# Patient Record
Sex: Female | Born: 1965 | Race: White | Hispanic: No | Marital: Married | Smoking: Never smoker
Health system: Southern US, Community
[De-identification: ages and names within clinical notes are randomized; demographics above are authoritative.]

## PROBLEM LIST (undated history)

## (undated) DIAGNOSIS — S92919A Unspecified fracture of unspecified toe(s), initial encounter for closed fracture: Secondary | ICD-10-CM

## (undated) DIAGNOSIS — M1990 Unspecified osteoarthritis, unspecified site: Secondary | ICD-10-CM

## (undated) HISTORY — DX: Unspecified fracture of unspecified toe(s), initial encounter for closed fracture: S92.919A

## (undated) HISTORY — DX: Unspecified osteoarthritis, unspecified site: M19.90

## (undated) HISTORY — PX: NO PAST SURGERIES: SHX2092

---

## 2002-09-12 ENCOUNTER — Encounter: Payer: Self-pay | Admitting: Family Medicine

## 2002-09-12 ENCOUNTER — Ambulatory Visit (HOSPITAL_COMMUNITY): Admission: RE | Admit: 2002-09-12 | Discharge: 2002-09-12 | Payer: Self-pay | Admitting: Family Medicine

## 2004-10-09 ENCOUNTER — Other Ambulatory Visit: Admission: RE | Admit: 2004-10-09 | Discharge: 2004-10-09 | Payer: Self-pay | Admitting: Obstetrics and Gynecology

## 2006-05-03 DIAGNOSIS — N63 Unspecified lump in unspecified breast: Secondary | ICD-10-CM | POA: Insufficient documentation

## 2006-12-19 ENCOUNTER — Encounter: Admission: RE | Admit: 2006-12-19 | Discharge: 2006-12-19 | Payer: Self-pay | Admitting: Obstetrics and Gynecology

## 2006-12-21 ENCOUNTER — Encounter: Admission: RE | Admit: 2006-12-21 | Discharge: 2006-12-21 | Payer: Self-pay | Admitting: Obstetrics and Gynecology

## 2008-05-22 ENCOUNTER — Encounter: Admission: RE | Admit: 2008-05-22 | Discharge: 2008-05-22 | Payer: Self-pay | Admitting: Obstetrics and Gynecology

## 2009-05-23 ENCOUNTER — Encounter: Admission: RE | Admit: 2009-05-23 | Discharge: 2009-05-23 | Payer: Self-pay | Admitting: Obstetrics and Gynecology

## 2009-05-30 ENCOUNTER — Encounter: Admission: RE | Admit: 2009-05-30 | Discharge: 2009-05-30 | Payer: Self-pay | Admitting: Obstetrics and Gynecology

## 2010-05-24 ENCOUNTER — Encounter: Payer: Self-pay | Admitting: Obstetrics and Gynecology

## 2011-11-22 ENCOUNTER — Other Ambulatory Visit: Payer: Self-pay | Admitting: Physical Medicine and Rehabilitation

## 2011-11-22 DIAGNOSIS — M5030 Other cervical disc degeneration, unspecified cervical region: Secondary | ICD-10-CM

## 2011-11-22 DIAGNOSIS — M542 Cervicalgia: Secondary | ICD-10-CM

## 2011-12-01 ENCOUNTER — Ambulatory Visit
Admission: RE | Admit: 2011-12-01 | Discharge: 2011-12-01 | Disposition: A | Discharge status: Home / Self Care | Payer: PRIVATE HEALTH INSURANCE | Source: Ambulatory Visit | Attending: Physical Medicine and Rehabilitation | Admitting: Physical Medicine and Rehabilitation

## 2011-12-01 DIAGNOSIS — M542 Cervicalgia: Secondary | ICD-10-CM

## 2011-12-01 DIAGNOSIS — M5030 Other cervical disc degeneration, unspecified cervical region: Secondary | ICD-10-CM

## 2013-10-24 ENCOUNTER — Encounter: Payer: Self-pay | Admitting: Cardiovascular Disease

## 2013-10-24 ENCOUNTER — Ambulatory Visit (INDEPENDENT_AMBULATORY_CARE_PROVIDER_SITE_OTHER): Payer: PRIVATE HEALTH INSURANCE | Admitting: Cardiovascular Disease

## 2013-10-24 VITALS — BP 120/76 | HR 63 | Ht 65.0 in | Wt 170.3 lb

## 2013-10-24 DIAGNOSIS — Z79899 Other long term (current) drug therapy: Secondary | ICD-10-CM

## 2013-10-24 DIAGNOSIS — Z13220 Encounter for screening for lipoid disorders: Secondary | ICD-10-CM

## 2013-10-24 DIAGNOSIS — R5383 Other fatigue: Secondary | ICD-10-CM

## 2013-10-24 DIAGNOSIS — M47812 Spondylosis without myelopathy or radiculopathy, cervical region: Secondary | ICD-10-CM

## 2013-10-24 DIAGNOSIS — R209 Unspecified disturbances of skin sensation: Secondary | ICD-10-CM

## 2013-10-24 DIAGNOSIS — R5381 Other malaise: Secondary | ICD-10-CM

## 2013-10-24 DIAGNOSIS — Z8249 Family history of ischemic heart disease and other diseases of the circulatory system: Secondary | ICD-10-CM

## 2013-10-24 DIAGNOSIS — R202 Paresthesia of skin: Secondary | ICD-10-CM

## 2013-10-24 DIAGNOSIS — I491 Atrial premature depolarization: Secondary | ICD-10-CM

## 2013-10-24 NOTE — Patient Instructions (Signed)
Dr Tresa EndoKelly has ordered to you have blood work to be done FASTING. (CBC, NMR Lipoprofile, TSH, CMET).  Your physician has requested that you have an exercise tolerance test. For further information please visit [email or web address]. Please also follow instruction sheet, as given.  Dr Tresa EndoKelly wants you to follow-up in 1 year. You will receive a reminder letter in the mail one months in advance. If you don't receive a letter, please call our office to schedule the follow-up appointment.

## 2013-10-25 ENCOUNTER — Encounter: Payer: Self-pay | Admitting: Cardiovascular Disease

## 2013-10-25 DIAGNOSIS — Z8249 Family history of ischemic heart disease and other diseases of the circulatory system: Secondary | ICD-10-CM | POA: Insufficient documentation

## 2013-10-25 DIAGNOSIS — I491 Atrial premature depolarization: Secondary | ICD-10-CM | POA: Insufficient documentation

## 2013-10-25 DIAGNOSIS — M47812 Spondylosis without myelopathy or radiculopathy, cervical region: Secondary | ICD-10-CM | POA: Insufficient documentation

## 2013-10-25 NOTE — Progress Notes (Signed)
Patient ID: Toya Smothersatricia Ann Mires, female   DOB: 21-Jan-1966, 48 y.o.   MRN: [id number]     PATIENT PROFILE: Toya Smothersatricia Ann Kolander is a 48 y.o. female presents to the office today for cardiology evaluation.  She is the daughter of my patient Mr. Minerva AreolaWilliam Jones.   HPI:  Toya Smothersatricia Ann Carley is a 48 y.o. female who denies any known awareness of cardiac disease.  However, her father is a long-standing patient of mine and developed coronary artery disease in his 5940s.  She has remained fairly active.  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  She does walk and exercises doing strength training, as well as cardiac workouts.  His sugar is family history in addition to her father and also her grandparents who have had heart attacks.  She now presents to establish cardiology care in light of her family history for heart disease and for CAD screening.  She denies exertional chest pain.  She is unaware of palpitations.  She denies presyncope or syncope.  There is no PND or orthopnea.  History reviewed. No pertinent past medical history.  She does have a history of DJD of her neck.   History reviewed. No pertinent past surgical history. There is no surgical history.  Allergies  Allergen Reactions  . Erythromycin Nausea Only    Current Outpatient Prescriptions  Medication Sig Dispense Refill  . Cyanocobalamin (VITAMIN B12 PO) Take 1 capsule by mouth daily.      . meloxicam (MOBIC) 7.5 MG tablet Take 7.5 mg by mouth daily as needed for pain.      . metaxalone (SKELAXIN) 800 MG tablet Take 800 mg by mouth daily as needed for muscle spasms.      . Multiple Vitamin (MULTIVITAMIN) tablet Take 1 tablet by mouth daily.      . norethindrone-ethinyl estradiol 1/35 (ORTHO-NOVUM 1/35, 28,) tablet Take 1 tablet by mouth daily.       No current facility-administered medications for this visit.    Social she is married for 26 years.   Works as a Librarian, academiclegal assistant.  Her [employer].  She completed 12th grade of education.  There is no tobacco use.  She does drink an occasional glass of wine.  Family History  Problem Relation Age of Onset  . Angina Father [id number]  . Heart disease Father   . Heart attack Paternal Grandfather     ROS General: Negative; No fevers, chills, or night sweats HEENT: Negative; No changes in vision or hearing, sinus congestion, difficulty swallowing Pulmonary: Negative; No cough, wheezing, shortness of breath, hemoptysis Cardiovascular:  See HPI; No chest pain, presyncope, syncope, palpitations, edema GI: Negative; No nausea, vomiting, diarrhea, or abdominal pain GU: Negative; No dysuria, hematuria, or difficulty voiding Musculoskeletal: Cervical degenerative joint disease involving C3/4;  Hematologic/Oncologic: Negative; no easy bruising, bleeding Endocrine: Negative; no heat/cold intolerance; no diabetes Neuro: Negative; no changes in balance, headaches Skin: Negative; No rashes or skin lesions Psychiatric: Negative; No behavioral problems, depression Sleep: Negative; No daytime sleepiness, hypersomnolence, bruxism, restless legs, hypnogagnic hallucinations Other comprehensive 14 point system review is negative   Physical Exam BP 120/76  Pulse 63  Ht 5\' 5"  (1.651 m)  Wt 170 lb 4.8 oz (77.248 kg)  BMI 28.34 kg/m2 General: Alert, oriented, no distress.  Skin: normal turgor, no rashes, warm and dry HEENT: Normocephalic, atraumatic. Pupils equal round and reactive to light; sclera anicteric; extraocular muscles intact; Fundi normal  Nose without nasal septal hypertrophy Mouth/Parynx benign; Mallinpatti scale 2 Neck: No JVD, no carotid bruits; normal carotid upstroke Lungs: clear to ausculatation and percussion; no wheezing or rales Chest wall: without tenderness to palpitation Heart: PMI not displaced, RRR, s1 s2 normal, faint1/6 systolic murmur, no diastolic murmur, no rubs, gallops,  thrills, or heaves Abdomen: soft, nontender; no hepatosplenomehaly, BS+; abdominal aorta nontender and not dilated by palpation. Back: no CVA tenderness Pulses 2+ Musculoskeletal: full range of motion, normal strength, no joint deformities Extremities: no clubbing cyanosis or edema, Homan's sign negative  Neurologic: grossly nonfocal; Cranial nerves grossly wnl Psychologic: Normal mood and affect   ECG (independently read by me): Normal sinus rhythm at 63 beats per minute with occasional atrial premature complexes.  LABS:  BMET No results found for this basename: na, k, cl, co2, glucose, bun, creatinine, calcium, gfrnonaa, gfraa     Hepatic Function Panel  No results found for this basename: prot, albumin, ast, alt, alkphos, bilitot, bilidir, ibili     CBC No results found for this basename: wbc, rbc, hgb, hct, plt, mcv, mch, mchc, rdw, neutrabs, lymphsabs, monoabs, eosabs, basosabs     BNP No results found for this basename: probnp    Lipid Panel  No results found for this basename: chol, trig, hdl, cholhdl, vldl, ldlcalc      RADIOLOGY: No results found.   ASSESSMENT AND PLAN: Ms. Bryson Haatricia Wussow is a 48 year old female with a strong family history for premature coronary disease.  She denies any episodes of chest tightness or significant change in exercise tolerance.  Her EKG today does show occasional premature atrial contractions , which she is asymptomatic and has not since.  He does do a fair amount of strenuous activity, including kickboxing.  She does experience episodes of arm tingling and I suspect this may be related to her neck disease.  In light of her family history for CAD, PACs, and arm prior seizures, I have recommended a a screening graded exercise treadmill test. this will be helpful to assess for blood pressure response to exercise, as well as to see if she does have any exercise-induced rhythm abnormalities or ECG changes.  I'm also scheduling her  for a complete set of blood work including a CBC, comprehensive metabolic panel, TSH, in light of her family history I'm scheduling her for NMR LipoProfile for more aggressive evaluation of her lipid status.  I will contact her regarding the results of the above studies and recommendations if necessary will be made at that time.  He did discuss potential use of enteric-coated baby aspirin 81 mg as long as she does not have any GI issues and is not taking meloxicam which she occasional takes.  As long as she is stable, I will see her in one year for reevaluation.   Lennette Biharihomas A. Henning Ehle, MD, [facility] 10/25/2013 7:57 AM

## 2013-11-15 LAB — COMPREHENSIVE METABOLIC PANEL
ALBUMIN: 3.9 g/dL (ref 3.5–5.2)
ALT: 21 U/L (ref 0–35)
AST: 16 U/L (ref 0–37)
Alkaline Phosphatase: 50 U/L (ref 39–117)
BUN: 9 mg/dL (ref 6–23)
CO2: 25 meq/L (ref 19–32)
Calcium: 9 mg/dL (ref 8.4–10.5)
Chloride: 105 mEq/L (ref 96–112)
Creat: 0.68 mg/dL (ref 0.50–1.10)
GLUCOSE: 109 mg/dL — AB (ref 70–99)
POTASSIUM: 4.5 meq/L (ref 3.5–5.3)
SODIUM: 137 meq/L (ref 135–145)
TOTAL PROTEIN: 6.6 g/dL (ref 6.0–8.3)
Total Bilirubin: 0.7 mg/dL (ref 0.2–1.2)

## 2013-11-15 LAB — TSH: TSH: 1.842 u[IU]/mL (ref 0.350–4.500)

## 2013-11-15 LAB — CBC
HCT: 38.5 % (ref 36.0–46.0)
Hemoglobin: 13.3 g/dL (ref 12.0–15.0)
MCH: 32.8 pg (ref 26.0–34.0)
MCHC: 34.5 g/dL (ref 30.0–36.0)
MCV: 94.8 fL (ref 78.0–100.0)
PLATELETS: 277 10*3/uL (ref 150–400)
RBC: 4.06 MIL/uL (ref 3.87–5.11)
RDW: 12.9 % (ref 11.5–15.5)
WBC: 4.3 10*3/uL (ref 4.0–10.5)

## 2013-11-16 ENCOUNTER — Encounter (HOSPITAL_COMMUNITY): Payer: PRIVATE HEALTH INSURANCE

## 2013-11-16 LAB — NMR LIPOPROFILE WITH LIPIDS
CHOLESTEROL, TOTAL: 218 mg/dL — AB (ref <200)
HDL Particle Number: 42 umol/L (ref >=30.5)
HDL SIZE: 9.6 nm (ref >=9.2)
HDL-C: 73 mg/dL (ref >=40)
LARGE HDL: 12.8 umol/L (ref >=4.8)
LARGE VLDL-P: 1.7 nmol/L (ref <=2.7)
LDL (calc): 120 mg/dL — ABNORMAL HIGH (ref <100)
LDL Particle Number: 1602 nmol/L — ABNORMAL HIGH (ref <1000)
LDL Size: 21.3 nm (ref >20.5)
LP-IR Score: <25 (ref <=45)
SMALL LDL PARTICLE NUMBER: 582 nmol/L — AB (ref <=527)
TRIGLYCERIDES: 126 mg/dL (ref <150)
VLDL Size: 40.6 nm (ref <=46.6)

## 2013-11-27 ENCOUNTER — Telehealth (HOSPITAL_COMMUNITY): Payer: Self-pay

## 2013-11-27 NOTE — Telephone Encounter (Signed)
Encounter complete. 

## 2013-11-28 ENCOUNTER — Telehealth (HOSPITAL_COMMUNITY): Payer: Self-pay

## 2013-11-28 NOTE — Telephone Encounter (Signed)
Encounter complete. 

## 2013-11-29 ENCOUNTER — Ambulatory Visit (HOSPITAL_COMMUNITY)
Admission: RE | Admit: 2013-11-29 | Discharge: 2013-11-29 | Disposition: A | Discharge status: Home / Self Care | Payer: PRIVATE HEALTH INSURANCE | Source: Ambulatory Visit | Attending: Cardiology | Admitting: Cardiology

## 2013-11-29 DIAGNOSIS — R202 Paresthesia of skin: Secondary | ICD-10-CM

## 2013-11-29 DIAGNOSIS — Z8249 Family history of ischemic heart disease and other diseases of the circulatory system: Secondary | ICD-10-CM | POA: Insufficient documentation

## 2013-11-29 DIAGNOSIS — R209 Unspecified disturbances of skin sensation: Secondary | ICD-10-CM | POA: Insufficient documentation

## 2013-11-29 NOTE — Procedures (Signed)
Exercise Treadmill Test   Test  Exercise Tolerance Test Ordering MD: Nicki Guadalajarahomas Kelly, MD  Interpreting MD:   Unique Test No: 1  Treadmill:  1  Indication for ETT: Palpitations/Rule Out Ischemia  Contraindication to ETT: Yes   Stress Modality: exercise - treadmill  Cardiac Imaging Performed: non   Protocol: standard Bruce - maximal  Max BP:  168/67  Max MPHR (bpm):  170 85% MPR (bpm):  144  MPHR obtained (bpm):  176 % MPHR obtained:  102  Reached 85% MPHR (min:sec):  6:20 Total Exercise Time (min-sec):  10:03  Workload in METS: 11.80 Borg Scale:   Reason ETT Terminated:  fatigue    ST Segment Analysis At Rest: normal ST segments - no evidence of significant ST depression With Exercise: significant ischemic ST depression  Other Information Arrhythmia:  No Angina during ETT:  absent (0) Quality of ETT:  diagnostic  ETT Interpretation:  abnormal - evidence of ST depression consistent with ischemia  Comments: Excellent exercise tolerance 2-3 mm horizontal ST depression inferiorly and laterally at peak exercise No chest pain Normal BP Response Clinical correlation is advised - further testing may be warranted.  Chrystie NoseKenneth C. Hilty, MD, [facility] Attending Cardiologist [facility]

## 2013-12-12 ENCOUNTER — Telehealth: Payer: Self-pay | Admitting: Cardiovascular Disease

## 2013-12-12 NOTE — Telephone Encounter (Signed)
Follow Up    Pt calling to follow up on blood work results. Please call.

## 2013-12-12 NOTE — Telephone Encounter (Signed)
Dr. Tresa EndoKelly Please review and make recommendations. Patient is calling for results.

## 2013-12-13 NOTE — Telephone Encounter (Signed)
Tanya Andrews - I believe TK has sent these results to you.  Belenda CruiseKristin

## 2013-12-19 ENCOUNTER — Telehealth: Payer: Self-pay | Admitting: Cardiovascular Disease

## 2013-12-19 ENCOUNTER — Telehealth (HOSPITAL_COMMUNITY): Payer: Self-pay | Admitting: *Deleted

## 2013-12-19 ENCOUNTER — Other Ambulatory Visit: Payer: Self-pay | Admitting: *Deleted

## 2013-12-19 DIAGNOSIS — R9439 Abnormal result of other cardiovascular function study: Secondary | ICD-10-CM

## 2013-12-19 DIAGNOSIS — R9431 Abnormal electrocardiogram [ECG] [EKG]: Secondary | ICD-10-CM

## 2013-12-19 NOTE — Telephone Encounter (Signed)
Returned a call to patient she had some questions about the exercise myoview that Dr. Tresa EndoKelly has ordered. We also discussed patients lab results. All questions were answered to patients satisfaction.

## 2013-12-19 NOTE — Telephone Encounter (Signed)
Please call,pt says she have some questions about a test that she is suppose to have.

## 2013-12-25 ENCOUNTER — Telehealth (HOSPITAL_COMMUNITY): Payer: Self-pay

## 2013-12-25 NOTE — Telephone Encounter (Signed)
Encounter complete. 

## 2013-12-26 ENCOUNTER — Telehealth (HOSPITAL_COMMUNITY): Payer: Self-pay

## 2013-12-26 ENCOUNTER — Telehealth (HOSPITAL_COMMUNITY): Payer: Self-pay | Admitting: *Deleted

## 2013-12-26 NOTE — Telephone Encounter (Signed)
Encounter complete. 

## 2013-12-27 ENCOUNTER — Ambulatory Visit (HOSPITAL_COMMUNITY)
Admission: RE | Admit: 2013-12-27 | Discharge: 2013-12-27 | Disposition: A | Discharge status: Home / Self Care | Payer: PRIVATE HEALTH INSURANCE | Source: Ambulatory Visit | Attending: Cardiovascular Disease | Admitting: Cardiovascular Disease

## 2013-12-27 DIAGNOSIS — R9431 Abnormal electrocardiogram [ECG] [EKG]: Secondary | ICD-10-CM | POA: Diagnosis present

## 2013-12-27 DIAGNOSIS — R9439 Abnormal result of other cardiovascular function study: Secondary | ICD-10-CM | POA: Diagnosis not present

## 2013-12-27 MED ORDER — TECHNETIUM TC 99M SESTAMIBI GENERIC - CARDIOLITE
30.2000 | Freq: Once | INTRAVENOUS | Status: AC | PRN
  Administered 2013-12-27: 30.2 via INTRAVENOUS

## 2013-12-27 MED ORDER — TECHNETIUM TC 99M SESTAMIBI GENERIC - CARDIOLITE
10.4000 | Freq: Once | INTRAVENOUS | Status: AC | PRN
  Administered 2013-12-27: 10 via INTRAVENOUS

## 2013-12-27 NOTE — Procedures (Addendum)
[facility] [address] [place] [address] [phone]  Cardiology Nuclear Med Study  Tanya Andrews is a 48 y.o. female     MRN : [id number]     DOB: Jun 21, 1965  Procedure Date: 12/27/2013  Nuclear Med Background Indication for Stress Test:  Evaluation for Ischemia and Abnormal EKG History:  No prior cardiac or respiratory history reported;No prior NUC MPI for comparison;ECHO on 11/29/2013-abnormal Cardiac Risk Factors: Family History - CAD and Overweight  Symptoms:  Chest Pain and Palpitations   Nuclear Pre-Procedure Caffeine/Decaff Intake:  1:00am NPO After: 11am   IV Site: R Forearm  IV 0.9% NS with Angio Cath:  22g  Chest Size (in):  n/a IV Started by: Berdie Ogren, RN  Height:  (1.651 m)  Cup Size: D  BMI:  Body mass index is 28.29 kg/(m^2). Weight:  170 lb (77.111 kg)   Tech Comments:  n/a    Nuclear Med Study 1 or 2 day study: 1 day  Stress Test Type:  Stress  Order Authorizing Provider:  Nicki Guadalajara, MD   Resting Radionuclide: Technetium 46m Sestamibi  Resting Radionuclide Dose: 10.4 mCi   Stress Radionuclide:  Technetium 32m Sestamibi  Stress Radionuclide Dose: 30.2 mCi           Stress Protocol Rest HR: 75 Stress HR: 166  Rest BP:122/82 Stress BP: 185/84  Exercise Time (min): 8:35 METS: 10.10   Predicted Max HR: 170 bpm % Max HR: 44.12 bpm Rate Pressure Product: 65784  Dose of Adenosine (mg):  n/a Dose of Lexiscan: n/a mg  Dose of Atropine (mg): n/a Dose of Dobutamine: n/a mcg/kg/min (at max HR)  Stress Test Technologist: Ernestene Mention, CCT Nuclear Technologist: Gonzella Lex, CNMT   Rest Procedure:  Myocardial perfusion imaging was performed at rest 45 minutes following the intravenous administration of Technetium 48m Sestamibi. Stress Procedure:  The patient performed treadmill exercise using a Bruce  Protocol for 8 minutes 35 seconds. The patient stopped due to generalized  fatigue. Patient denied any chest pain.  There were significant ST-T wave changes.  Technetium 29m Sestamibi was injected at peak exercise and myocardial perfusion imaging was performed after a brief delay.  Transient Ischemic Dilatation (Normal <1.22):  1.07  QGS EDV:  73 ml QGS ESV:  28 ml LV Ejection Fraction: 62%        Rest ECG: NSR, RVCD.  Stress ECG: Significant ST abnormalities consistent with ischemia.  QPS Raw Data Images:  There is interference from nuclear activity from structures below the diaphragm. This does not affect the ability to read the study. Stress Images:  There is decreased uptake in the anterior wall. Rest Images:  There is decreased uptake in the anterior wall. Subtraction (SDS):  No evidence of ischemia.  Impression Exercise Capacity:  Fair exercise capacity. BP Response:  Normal blood pressure response. Clinical Symptoms:  No chest pain or dyspnea. ECG Impression:  Significant ST changes consistent with ischemia. Comparison with Prior Nuclear Study: No previous nuclear study performed  Overall Impression:  Low risk stress nuclear study with a small, moderate intensity, fixed anterior defect consistent with breast attenuation; no ischemia. Note positive ECG changes.  LV Wall Motion:  NL LV Function; NL Wall Motion   [name], MD  12/27/2013 4:49 PM

## 2015-08-08 ENCOUNTER — Telehealth: Payer: Self-pay | Admitting: Cardiovascular Disease

## 2015-08-08 NOTE — Telephone Encounter (Signed)
Pt of Dr. Tresa EndoKelly.  Returned phone call to patient. She describes radiating, dull pain - from her neck down through arm.  States she's had problem x ~2 months. Pt has longstanding history of cervical spine issues.  She also notes leg tingling which began recently.  Pt has a consultation w/ ortho/sports medicine for this issue & wants to make sure OK. Encouraged her that this is appropriate - if they feel is not nerve related but a PV issue,  they can order appropriate imaging or would call our office for guidance.  Advised pt to call if new symptoms. Pt aware if she feels she needs to be seen sooner by cardiology,  we can accomodate for PA visit based on acuity. At her request, scheduled for return OV w/ Dr. Tresa EndoKelly.  Pt amenable to plan going forward.

## 2015-08-08 NOTE — Telephone Encounter (Signed)
New message  Pt called for a same day appt.   Pt states that she has a heaviness in her arm she wants to make sure she doesn't have a blood clot. Heaviness for about 3-4 weeks.wants to rule out anything heart related.

## 2015-08-12 ENCOUNTER — Other Ambulatory Visit: Payer: Self-pay | Admitting: Physical Medicine and Rehabilitation

## 2015-08-12 DIAGNOSIS — M542 Cervicalgia: Principal | ICD-10-CM

## 2015-08-12 DIAGNOSIS — G8929 Other chronic pain: Secondary | ICD-10-CM

## 2015-08-25 ENCOUNTER — Ambulatory Visit
Admission: RE | Admit: 2015-08-25 | Discharge: 2015-08-25 | Disposition: A | Discharge status: Home / Self Care | Payer: PRIVATE HEALTH INSURANCE | Source: Ambulatory Visit | Attending: Physical Medicine and Rehabilitation | Admitting: Physical Medicine and Rehabilitation

## 2015-08-25 DIAGNOSIS — G8929 Other chronic pain: Secondary | ICD-10-CM

## 2015-08-25 DIAGNOSIS — M542 Cervicalgia: Principal | ICD-10-CM

## 2015-09-23 DIAGNOSIS — N951 Menopausal and female climacteric states: Secondary | ICD-10-CM | POA: Insufficient documentation

## 2015-11-10 ENCOUNTER — Telehealth: Payer: Self-pay

## 2015-11-10 NOTE — Telephone Encounter (Signed)
lmtcb need to update family and medical history

## 2015-11-14 ENCOUNTER — Ambulatory Visit (INDEPENDENT_AMBULATORY_CARE_PROVIDER_SITE_OTHER): Payer: PRIVATE HEALTH INSURANCE | Admitting: Cardiovascular Disease

## 2015-11-14 ENCOUNTER — Encounter: Payer: Self-pay | Admitting: Cardiovascular Disease

## 2015-11-14 VITALS — BP 106/65 | HR 73 | Ht 65.0 in | Wt 171.2 lb

## 2015-11-14 DIAGNOSIS — Z79899 Other long term (current) drug therapy: Secondary | ICD-10-CM

## 2015-11-14 DIAGNOSIS — M47812 Spondylosis without myelopathy or radiculopathy, cervical region: Secondary | ICD-10-CM

## 2015-11-14 DIAGNOSIS — Z8249 Family history of ischemic heart disease and other diseases of the circulatory system: Secondary | ICD-10-CM | POA: Diagnosis not present

## 2015-11-14 DIAGNOSIS — I491 Atrial premature depolarization: Secondary | ICD-10-CM | POA: Diagnosis not present

## 2015-11-14 NOTE — Patient Instructions (Signed)
Your physician recommends that you return for lab work fasting.   Your physician recommends that you schedule a follow-up appointment in: 2 years or sooner if needed.

## 2015-11-16 ENCOUNTER — Encounter: Payer: Self-pay | Admitting: Cardiovascular Disease

## 2015-11-16 NOTE — Progress Notes (Signed)
Patient ID: Elvis Boot, female   DOB: Mar 01, 1966, 50 y.o.   MRN: [id number]     PATIENT PROFILE: Burnice Vassel is a 50 y.o. female presents to the office today for a 2 year follow-up cardiology evaluation.  She is the daughter of my patient Mr. Iona Coach.   HPI:  Eboney Claybrook denies any known awareness of cardiac disease.  Her father is a long-standing patient of mine and developed coronary artery disease in his 60s.  She has remained fairly active.  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  She does walk and exercises doing strength training, as well as cardiac workouts.  His sugar is family history in addition to her father and also her grandparents who have had heart attacks.  I saw  Her 2 years ago when she presented to establish cardiology care in light of her family history for heart disease and for CAD screening.  I scheduled her for a routine treadmill test for screening evaluation and this was abnormal with the development of asymptomatic ST segment depression of 2-3 mm inferiorly and laterally.  As result, she was referred for a nuclear stress test which was done on 12/27/2013.  She again developed ST-T wave abnormalities which were asymptomatic.  Perfusion imaging was felt to be low risk and there was suggestion of probable breast attenuation without associated ischemia.  Over the past 2 years.  She is continued to be asymptomatic.  She exercises regularly.  She denies exertional chest pain.  She is unaware of palpitations.  She denies presyncope or syncope.  There is no PND or orthopnea.  She presents for 2 year follow-up evaluation.  Past Medical History  Diagnosis Date  . Degenerative joint disease     C3 and C4 vertebrae    She does have a history of DJD of her neck.   Past Surgical History  Procedure Laterality Date  . No past surgeries     There is no surgical  history.  Allergies  Allergen Reactions  . Erythromycin Nausea Only    Current Outpatient Prescriptions  Medication Sig Dispense Refill  . cyanocobalamin 100 MCG tablet Take 100 mcg by mouth daily.    . Fish Oil-Cholecalciferol (FISH OIL + D3 PO) Take by mouth.    . Multiple Vitamin (MULTIVITAMIN) tablet Take 1 tablet by mouth daily.     No current facility-administered medications for this visit.    Social she is married for 26 years.  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  She completed 12th grade of education.  There is no tobacco use.  She does drink an occasional glass of wine.  Family History  Problem Relation Age of Onset  . Angina Father 70  . Heart disease Father   . Heart attack Paternal Grandfather     ROS General: Negative; No fevers, chills, or night sweats HEENT: Negative; No changes in vision or hearing, sinus congestion, difficulty swallowing Pulmonary: Negative; No cough, wheezing, shortness of breath, hemoptysis Cardiovascular:  See HPI; No chest pain, presyncope, syncope, palpitations, edema GI: Negative; No nausea, vomiting, diarrhea, or abdominal pain GU: Negative; No dysuria, hematuria, or difficulty voiding Musculoskeletal: Cervical degenerative joint disease involving C3/4;  Hematologic/Oncologic: Negative; no easy bruising, bleeding Endocrine: Negative; no heat/cold intolerance; no diabetes Neuro: Negative; no changes in balance, headaches Skin: Negative; No rashes or skin lesions Psychiatric: Negative; No behavioral problems, depression Sleep:  Negative; No daytime sleepiness, hypersomnolence, bruxism, restless legs, hypnogagnic hallucinations Other comprehensive 14 point system review is negative   Physical Exam BP 106/65 mmHg  Pulse 73  Ht '5\' 5"'$  (1.651 m)  Wt 171 lb 3.2 oz (77.656 kg)  BMI 28.49 kg/m2   Wt Readings from Last 3 Encounters:  11/14/15 171 lb 3.2 oz (77.656 kg)  08/25/15 170 lb (77.111 kg)  12/27/13 170 lb  (77.111 kg)   General: Alert, oriented, no distress.  Skin: normal turgor, no rashes, warm and dry HEENT: Normocephalic, atraumatic. Pupils equal round and reactive to light; sclera anicteric; extraocular muscles intact; Fundi normal Nose without nasal septal hypertrophy Mouth/Parynx benign; Mallinpatti scale 2 Neck: No JVD, no carotid bruits; normal carotid upstroke Lungs: clear to ausculatation and percussion; no wheezing or rales Chest wall: without tenderness to palpitation Heart: PMI not displaced, RRR, s1 s2 normal, LGXQJ1/9 systolic murmur, no diastolic murmur, no rubs, gallops, thrills, or heaves Abdomen: soft, nontender; no hepatosplenomehaly, BS+; abdominal aorta nontender and not dilated by palpation. Back: no CVA tenderness Pulses 2+ Musculoskeletal: full range of motion, normal strength, no joint deformities Extremities: no clubbing cyanosis or edema, Homan's sign negative  Neurologic: grossly nonfocal; Cranial nerves grossly wnl Psychologic: Normal mood and affect  ECG (independently read by me): Normal sinus rhythm at 70 bpm.  Nondiagnostic T-wave change and 3.  Normal intervals.  ECG (independently read by me): Normal sinus rhythm at 63 beats per minute with occasional atrial premature complexes.  LABS: BMP Latest Ref Rng 11/15/2013  Glucose 70 - 99 mg/dL 109(H)  BUN 6 - 23 mg/dL 9  Creatinine 0.50 - 1.10 mg/dL 0.68  Sodium 135 - 145 mEq/L 137  Potassium 3.5 - 5.3 mEq/L 4.5  Chloride 96 - 112 mEq/L 105  CO2 19 - 32 mEq/L 25  Calcium 8.4 - 10.5 mg/dL 9.0   Hepatic Function Latest Ref Rng 11/15/2013  Total Protein 6.0 - 8.3 g/dL 6.6  Albumin 3.5 - 5.2 g/dL 3.9  AST 0 - 37 U/L 16  ALT 0 - 35 U/L 21  Alk Phosphatase 39 - 117 U/L 50  Total Bilirubin 0.2 - 1.2 mg/dL 0.7    CBC Latest Ref Rng 11/15/2013  WBC 4.0 - 10.5 K/uL 4.3  Hemoglobin 12.0 - 15.0 g/dL 13.3  Hematocrit 36.0 - 46.0 % 38.5  Platelets 150 - 400 K/uL 277    Lab Results  Component Value Date    MCV 94.8 11/15/2013    Lab Results  Component Value Date   TSH 1.842 11/15/2013   No results found for: HGBA1C  Lipid Panel     Component Value Date/Time   CHOL 218* 11/15/2013 0810   TRIG 126 11/15/2013 0810   HDL 73 11/15/2013 0810   LDLCALC 120* 11/15/2013 0810   RADIOLOGY: No results found.   ASSESSMENT AND PLAN: Ms. Raniyah Curenton is a 50 year old female with a strong family history for premature coronary disease.  She is asymptomatic with reference to chest pain, or any change in exercise tolerance.  At times she does have issues related to her cervical degenerative disc disease.  When I saw HER-2 years ago I referred her for routine treadmill test.  She developed asymptomatic ST segment depression which resulted in a subsequent nuclear perfusion study.  She again developed a systematic ST segment changes but was felt to have fairly normal perfusion with only mild breast attenuation artifact without associated ischemia.  I reviewed both her routine treadmill test and nuclear stress findings  with her in detail.  Presently, she continues to be active.  She specifically denies any change in exercise tolerance.  She did have mild lipid elevation  2 years ago.  I am scheduling her for follow-up blood work in the fasting state for further evaluation.  Presently, she is on meloxicam and Skelaxin on an as-needed basis.  I will contact her with results of blood work.  As long as she remains stable, I will see her in 2 years for reevaluation.  Troy Sine, MD, [facility] 11/16/2015 2:55 PM

## 2015-11-25 NOTE — Addendum Note (Signed)
Addended by: Freddi Starr on: 11/25/2015 04:14 PM   Modules accepted: Orders

## 2016-06-03 DIAGNOSIS — Z86010 Personal history of colonic polyps: Secondary | ICD-10-CM | POA: Insufficient documentation

## 2016-10-01 DIAGNOSIS — N952 Postmenopausal atrophic vaginitis: Secondary | ICD-10-CM | POA: Insufficient documentation

## 2018-03-20 DIAGNOSIS — Z803 Family history of malignant neoplasm of breast: Secondary | ICD-10-CM | POA: Insufficient documentation

## 2020-06-05 ENCOUNTER — Other Ambulatory Visit: Payer: Self-pay | Admitting: Obstetrics and Gynecology

## 2020-06-05 DIAGNOSIS — Z1231 Encounter for screening mammogram for malignant neoplasm of breast: Secondary | ICD-10-CM

## 2020-07-01 ENCOUNTER — Encounter: Payer: 59 | Admitting: Obstetrics and Gynecology

## 2020-07-22 ENCOUNTER — Ambulatory Visit: Payer: PRIVATE HEALTH INSURANCE

## 2020-07-23 ENCOUNTER — Ambulatory Visit
Admission: RE | Admit: 2020-07-23 | Discharge: 2020-07-23 | Disposition: A | Discharge status: Home / Self Care | Payer: PRIVATE HEALTH INSURANCE | Source: Ambulatory Visit | Attending: Obstetrics and Gynecology | Admitting: Obstetrics and Gynecology

## 2020-07-23 ENCOUNTER — Other Ambulatory Visit: Payer: Self-pay

## 2020-07-23 DIAGNOSIS — Z1231 Encounter for screening mammogram for malignant neoplasm of breast: Secondary | ICD-10-CM

## 2020-07-28 ENCOUNTER — Ambulatory Visit (INDEPENDENT_AMBULATORY_CARE_PROVIDER_SITE_OTHER): Payer: 59 | Admitting: Obstetrics and Gynecology

## 2020-07-28 ENCOUNTER — Encounter: Payer: Self-pay | Admitting: Obstetrics and Gynecology

## 2020-07-28 ENCOUNTER — Other Ambulatory Visit (HOSPITAL_COMMUNITY)
Admission: RE | Admit: 2020-07-28 | Discharge: 2020-07-28 | Disposition: A | Discharge status: Home / Self Care | Payer: 59 | Source: Ambulatory Visit | Attending: Obstetrics and Gynecology | Admitting: Obstetrics and Gynecology

## 2020-07-28 ENCOUNTER — Other Ambulatory Visit: Payer: Self-pay

## 2020-07-28 VITALS — BP 114/66 | HR 87 | Ht 65.0 in | Wt 166.0 lb

## 2020-07-28 DIAGNOSIS — Z01419 Encounter for gynecological examination (general) (routine) without abnormal findings: Secondary | ICD-10-CM | POA: Insufficient documentation

## 2020-07-28 MED ORDER — ESTRADIOL 0.1 MG/GM VA CREA: TOPICAL_CREAM | VAGINAL | 2 refills | Status: DC

## 2020-07-28 NOTE — Patient Instructions (Signed)
EXERCISE AND DIET:  We recommended that you start or continue a regular exercise program for good health. Regular exercise means any activity that makes your heart beat faster and makes you sweat.  We recommend exercising at least 30 minutes per day at least 3 days a week, preferably 4 or 5.  We also recommend a diet low in fat and sugar.  Inactivity, poor dietary choices and obesity can cause diabetes, heart attack, stroke, and kidney damage, among others.    ALCOHOL AND SMOKING:  Women should limit their alcohol intake to no more than 7 drinks/beers/glasses of wine (combined, not each!) per week. Moderation of alcohol intake to this level decreases your risk of breast cancer and liver damage. And of course, no recreational drugs are part of a healthy lifestyle.  And absolutely no smoking or even second hand smoke. Most people know smoking can cause heart and lung diseases, but did you know it also contributes to weakening of your bones? Aging of your skin?  Yellowing of your teeth and nails?  CALCIUM AND VITAMIN D:  Adequate intake of calcium and Vitamin D are recommended.  The recommendations for exact amounts of these supplements seem to change often, but generally speaking 600 mg of calcium (either carbonate or citrate) and 800 units of Vitamin D per day seems prudent. Certain women may benefit from higher intake of Vitamin D.  If you are among these women, your doctor will have told you during your visit.    PAP SMEARS:  Pap smears, to check for cervical cancer or precancers,  have traditionally been done yearly, although recent scientific advances have shown that most women can have pap smears less often.  However, every woman still should have a physical exam from her gynecologist every year. It will include a breast check, inspection of the vulva and vagina to check for abnormal growths or skin changes, a visual exam of the cervix, and then an exam to evaluate the size and shape of the uterus and  ovaries.  And after 57 years of age, a rectal exam is indicated to check for rectal cancers. We will also provide age appropriate advice regarding health maintenance, like when you should have certain vaccines, screening for sexually transmitted diseases, bone density testing, colonoscopy, mammograms, etc.   MAMMOGRAMS:  All women over 40 years old should have a yearly mammogram. Many facilities now offer a "3D" mammogram, which may cost around $50 extra out of pocket. If possible,  we recommend you accept the option to have the 3D mammogram performed.  It both reduces the number of women who will be called back for extra views which then turn out to be normal, and it is better than the routine mammogram at detecting truly abnormal areas.    COLONOSCOPY:  Colonoscopy to screen for colon cancer is recommended for all women at age 50.  We know, you hate the idea of the prep.  We agree, BUT, having colon cancer and not knowing it is worse!!  Colon cancer so often starts as a polyp that can be seen and removed at colonscopy, which can quite literally save your life!  And if your first colonoscopy is normal and you have no family history of colon cancer, most women don't have to have it again for 10 years.  Once every ten years, you can do something that may end up saving your life, right?  We will be happy to help you get it scheduled when you are ready.    Be sure to check your insurance coverage so you understand how much it will cost.  It may be covered as a preventative service at no cost, but you should check your particular policy.      Calcium Content in Foods Calcium is the most abundant mineral in the body. Most of the body's calcium supply is stored in bones and teeth. Calcium helps many parts of the body function normally, including:  Blood and blood vessels.  Nerves.  Hormones.  Muscles.  Bones and teeth. When your calcium stores are low, you may be at risk for low bone mass, bone loss, and  broken bones (fractures). When you get enough calcium, it helps to support strong bones and teeth throughout your life. Calcium is especially important for:  Children during growth spurts.  Girls during adolescence.  Women who are pregnant or breastfeeding.  Women after their menstrual cycle stops (postmenopause).  Women whose menstrual cycle has stopped due to anorexia nervosa or regular intense exercise.  People who cannot eat or digest dairy products.  Vegans. Recommended daily amounts of calcium:  Women (ages 44 to 30): 1,000 mg per day.  Women (ages 49 and older): 1,200 mg per day.  Men (ages 31 to 46): 1,000 mg per day.  Men (ages 41 and older): 1,200 mg per day.  Women (ages 63 to 44): 1,300 mg per day.  Men (ages 22 to 50): 1,300 mg per day. General information  Eat foods that are high in calcium. Try to get most of your calcium from food.  Some people may benefit from taking calcium supplements. Check with your health care provider or diet and nutrition specialist (dietitian) before starting any calcium supplements. Calcium supplements may interact with certain medicines. Too much calcium may cause other health problems, such as constipation and kidney stones.  For the body to absorb calcium, it needs vitamin D. Sources of vitamin D include: ? Skin exposure to direct sunlight. ? Foods, such as egg yolks, liver, mushrooms, saltwater fish, and fortified milk. ? Vitamin D supplements. Check with your health care provider or dietitian before starting any vitamin D supplements. What foods are high in calcium? Foods that are high in calcium contain more than 100 milligrams per serving. Fruits  Fortified orange juice or other fruit juice, 300 mg per 8 oz serving. Vegetables  Collard greens, 360 mg per 8 oz serving.  Kale, 100 mg per 8 oz serving.  Bok choy, 160 mg per 8 oz serving. Grains  Fortified ready-to-eat cereals, 100 to 1,000 mg per 8 oz  serving.  Fortified frozen waffles, 200 mg in 2 waffles.  Oatmeal, 140 mg in 1 cup. Meats and other proteins  Sardines, canned with bones, 325 mg per 3 oz serving.  Salmon, canned with bones, 180 mg per 3 oz serving.  Canned shrimp, 125 mg per 3 oz serving.  Baked beans, 160 mg per 4 oz serving.  Tofu, firm, made with calcium sulfate, 253 mg per 4 oz serving. Dairy  Yogurt, plain, low-fat, 310 mg per 6 oz serving.  Nonfat milk, 300 mg per 8 oz serving.  American cheese, 195 mg per 1 oz serving.  Cheddar cheese, 205 mg per 1 oz serving.  Cottage cheese 2%, 105 mg per 4 oz serving.  Fortified soy, rice, or almond milk, 300 mg per 8 oz serving.  Mozzarella, part skim, 210 mg per 1 oz serving. The items listed above may not be a complete list of foods high in calcium.  Actual amounts of calcium may be different depending on processing. Contact a dietitian for more information.   What foods are lower in calcium? Foods that are lower in calcium contain 50 mg or less per serving. Fruits  Apple, about 6 mg.  Banana, about 12 mg. Vegetables  Lettuce, 19 mg per 2 oz serving.  Tomato, about 11 mg. Grains  Rice, 4 mg per 6 oz serving.  Boiled potatoes, 14 mg per 8 oz serving.  White bread, 6 mg per slice. Meats and other proteins  Egg, 27 mg per 2 oz serving.  Red meat, 7 mg per 4 oz serving.  Chicken, 17 mg per 4 oz serving.  Fish, cod, or trout, 20 mg per 4 oz serving. Dairy  Cream cheese, regular, 14 mg per 1 Tbsp serving.  Brie cheese, 50 mg per 1 oz serving.  Parmesan cheese, 70 mg per 1 Tbsp serving. The items listed above may not be a complete list of foods lower in calcium. Actual amounts of calcium may be different depending on processing. Contact a dietitian for more information. Summary  Calcium is an important mineral in the body because it affects many functions. Getting enough calcium helps support strong bones and teeth throughout your  life.  Try to get most of your calcium from food.  Calcium supplements may interact with certain medicines. Check with your health care provider or dietitian before starting any calcium supplements. This information is not intended to replace advice given to you by your health care provider. Make sure you discuss any questions you have with your health care provider. Document Revised: 08/15/2019 Document Reviewed: 08/15/2019 Elsevier Patient Education  2021 [employer].

## 2020-07-28 NOTE — Progress Notes (Signed)
55 y.o. G16P0000 Married Caucasian female here for annual exam.    Patient complaining of vaginal dryness with intercourse. Using vagifem and estradiol cream.  Placing vagifem once a week.  Placing 2 grams of estradiol cream once a week. Her concern is dryness at the opening of the vagina.  Using vulvar balm for the vulva.   No itching and burning, but does have dryness.   Some night flushes.   Having weight challenges.  Feels better to be gluten free.   PCP: Donald Prose, MD    Patient's last menstrual period was 05/04/2015 (approximate).           Sexually active: Yes.    The current method of family planning is post menopausal status.    Exercising: Yes.    walking, pickle ball, golf Smoker:  no  Health Maintenance: Pap:  03/2019 normal per patient History of abnormal Pap:  no MMG: 07-23-20 BI-RADS 1, cat C density.  Colonoscopy: 2019 polyp;next 2024 BMD:   n/a  Result  n/a TDaP: up to date with PCP Gardasil:   no HIV:no Hep C:no Screening Labs:  PCP.    reports that she has never smoked. She has never used smokeless tobacco. She reports current alcohol use of about 5.0 standard drinks of alcohol per week.  Past Medical History:  Diagnosis Date  . 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 NO PAST SURGERIES      Current Outpatient Medications  Medication Sig Dispense Refill  . cyanocobalamin 100 MCG tablet Take 100 mcg by mouth daily.    Marland Kitchen estradiol (ESTRACE) 0.1 MG/GM vaginal cream estradiol 0.01% (0.1 mg/gram) vaginal cream  INSERT 1/2 APPLICATORFUL DAILY VAGINALLY AS INSTRUCTED    . Estradiol 10 MCG TABS vaginal tablet Vagifem 10 mcg vaginal tablet  one tablet twice a week.    . Fish Oil-Cholecalciferol (FISH OIL + D3 PO) Take by mouth.    . fluticasone (FLONASE) 50 MCG/ACT nasal spray 2 sprays as needed    . Krill Oil Omega-3 500 MG CAPS 2 capsules    . magnesium 30 MG tablet Take 30 mg by  mouth 2 (two) times daily.    . Multiple Vitamin (MULTIVITAMIN) tablet Take 1 tablet by mouth daily.    Marland Kitchen OVER THE COUNTER MEDICATION Nutrfil Optimal-M    . UNABLE TO FIND Med Name: Optimal V Takes 1 tablet daily     No current facility-administered medications for this visit.    Family History  Problem Relation Age of Onset  . Angina Father 58  . Heart disease Father   . Heart attack Paternal Grandfather   . Breast cancer Mother        71 & 29 had lumpectomy first time then mastectomy--Neg BRCA  . Diabetes Mother        diet controlled  . Hypertension Mother     Review of Systems  All other systems reviewed and are negative.   Exam:   BP 114/66   Pulse 87   Ht 5' 5" (1.651 m)   Wt 166 lb (75.3 kg)   LMP 05/04/2015 (Approximate)   SpO2 100%   BMI 27.62 kg/m     General appearance: alert, cooperative and appears stated age Head: normocephalic, without obvious abnormality, atraumatic Neck: no adenopathy, supple, symmetrical, trachea midline and thyroid normal to inspection and palpation Lungs: clear to auscultation bilaterally Breasts: normal appearance, no masses or  tenderness, No nipple retraction or dimpling, No nipple discharge or bleeding, No axillary adenopathy Heart: regular rate and rhythm Abdomen: soft, non-tender; no masses, no organomegaly Extremities: extremities normal, atraumatic, no cyanosis or edema Skin: skin color, texture, turgor normal. No rashes or lesions Lymph nodes: cervical, supraclavicular, and axillary nodes normal. Neurologic: grossly normal  Pelvic: External genitalia:  no lesions              No abnormal inguinal nodes palpated.              Urethra:  normal appearing urethra with no masses, tenderness or lesions              Bartholins and Skenes: normal                 Vagina: normal appearing vagina with normal color and discharge, no lesions              Cervix: no lesions              Pap taken: Yes.   Bimanual Exam:  Uterus:   normal size, contour, position, consistency, mobility, non-tender              Adnexa: no mass, fullness, tenderness              Rectal exam: Yes.  .  Confirms.              Anus:  normal sphincter tone, no lesions  Chaperone was present for exam.  Assessment:   Well woman visit with normal exam. FH breast cancer.  Mother.  Negative BRCA testing.  Menopausal atrophy.   Plan: Mammogram screening discussed. Self breast awareness reviewed. Pap and HR HPV as above. Guidelines for Calcium, Vitamin D, regular exercise program including cardiovascular and weight bearing exercise. Will stop vagifem and use estradiol cream 1/2 gram pv at hs every other day.   Labs with PCP.  Follow up annually and prn.   After visit summary provided.

## 2020-07-29 LAB — CYTOLOGY - PAP
Comment: NEGATIVE
Diagnosis: NEGATIVE
High risk HPV: NEGATIVE

## 2020-11-22 DIAGNOSIS — M545 Low back pain, unspecified: Secondary | ICD-10-CM | POA: Insufficient documentation

## 2021-06-19 ENCOUNTER — Other Ambulatory Visit: Payer: Self-pay | Admitting: Obstetrics and Gynecology

## 2021-06-19 DIAGNOSIS — Z1231 Encounter for screening mammogram for malignant neoplasm of breast: Secondary | ICD-10-CM

## 2021-07-24 ENCOUNTER — Ambulatory Visit: Payer: 59

## 2021-07-24 ENCOUNTER — Ambulatory Visit
Admission: RE | Admit: 2021-07-24 | Discharge: 2021-07-24 | Disposition: A | Discharge status: Home / Self Care | Payer: 59 | Source: Ambulatory Visit | Attending: Obstetrics and Gynecology | Admitting: Obstetrics and Gynecology

## 2021-07-24 DIAGNOSIS — Z1231 Encounter for screening mammogram for malignant neoplasm of breast: Secondary | ICD-10-CM

## 2021-08-11 ENCOUNTER — Encounter: Payer: Self-pay | Admitting: Obstetrics and Gynecology

## 2021-08-11 ENCOUNTER — Ambulatory Visit (INDEPENDENT_AMBULATORY_CARE_PROVIDER_SITE_OTHER): Payer: 59 | Admitting: Obstetrics and Gynecology

## 2021-08-11 VITALS — BP 104/62 | HR 61 | Ht 64.5 in | Wt 166.0 lb

## 2021-08-11 DIAGNOSIS — Z01419 Encounter for gynecological examination (general) (routine) without abnormal findings: Secondary | ICD-10-CM

## 2021-08-11 MED ORDER — ESTRADIOL 0.1 MG/GM VA CREA: TOPICAL_CREAM | VAGINAL | 2 refills | Status: DC

## 2021-08-11 NOTE — Progress Notes (Signed)
56 y.o. G44P0000 Married Caucasian female here for annual exam.   ? ?Followed for vaginal atrophy.  ?Using vaginal estrogen cream, 1/2 gram 2 - 3 times per week.  ?It is working well.  ? ?PCP:   [place] @ [facility] ? ?Patient's last menstrual period was 05/04/2015 (approximate).     ?  ?    ?Sexually active: Yes.    ?The current method of family planning is post menopausal status.    ?Exercising: Yes.     Works out at gym 2 days/week, yoga, walking ?Smoker:  no ? ?Health Maintenance: ?Pap:  07-28-20 Neg:Neg HR HPV, 03/2019 normal per patient ?History of abnormal Pap:  no ?MMG:  07-24-21 Neg:Neg HR HPv ?Colonoscopy:  2019 polyp;next 2024 ?BMD:   n/a  Result  n/a ?TDaP:  PCP ?Gardasil:   n/a ?HIV: no ?Hep C:no ?Screening Labs:  PCP. ? ? reports that she has never smoked. She has never used smokeless tobacco. She reports current alcohol use of about 5.0 standard drinks per week. She reports that she does not use drugs. ? ?Past Medical History:  ?Diagnosis Date  ? Degenerative joint disease   ? C3 and C4 vertebrae  ? Toe fracture   ? late 20s  ? ? ?Past Surgical History:  ?Procedure Laterality Date  ? NO PAST SURGERIES    ? ? ?Current Outpatient Medications  ?Medication Sig Dispense Refill  ? estradiol (ESTRACE) 0.1 MG/GM vaginal cream Place 1/2 gram per vagina at bedtime every other day. 42.5 g 2  ? Krill Oil Omega-3 500 MG CAPS 2 capsules    ? magnesium 30 MG tablet Take 30 mg by mouth 2 (two) times daily.    ? metaxalone (SKELAXIN) 800 MG tablet metaxalone 800 mg tablet ? Take 1 tablet 3 times a day by oral route.    ? naproxen (NAPROSYN) 500 MG tablet naproxen 500 mg tablet ? Take 1 tablet twice a day by oral route.    ? OVER THE COUNTER MEDICATION Nutrfil Optimal-M    ? UNABLE TO FIND Med Name: Optimal V ?Takes 1 tablet daily    ? ?No current facility-administered medications for this visit.  ? ? ?Family History  ?Problem Relation Age of Onset  ? Angina Father 23  ? Heart disease Father   ? Heart attack Paternal  Grandfather   ? Breast cancer Mother   ?     43 & 67 had lumpectomy first time then mastectomy--Neg BRCA  ? Diabetes Mother   ?     diet controlled  ? Hypertension Mother   ? ? ?Review of Systems  ?All other systems reviewed and are negative. ? ?Exam:   ?BP 104/62   Pulse 61   Ht 5' 4.5" (1.638 m)   Wt 166 lb (75.3 kg)   LMP 05/04/2015 (Approximate)   SpO2 98%   BMI 28.05 kg/m?     ?General appearance: alert, cooperative and appears stated age ?Head: normocephalic, without obvious abnormality, atraumatic ?Neck: no adenopathy, supple, symmetrical, trachea midline and thyroid normal to inspection and palpation ?Lungs: clear to auscultation bilaterally ?Breasts: normal appearance, no masses or tenderness, No nipple retraction or dimpling, No nipple discharge or bleeding, No axillary adenopathy ?Heart: regular rate and rhythm ?Abdomen: soft, non-tender; no masses, no organomegaly ?Extremities: extremities normal, atraumatic, no cyanosis or edema ?Skin: skin color, texture, turgor normal. No rashes or lesions ?Lymph nodes: cervical, supraclavicular, and axillary nodes normal. ?Neurologic: grossly normal ? ?Pelvic: External genitalia:  no lesions ?  No abnormal inguinal nodes palpated. ?             Urethra:  normal appearing urethra with no masses, tenderness or lesions ?             Bartholins and Skenes: normal    ?             Vagina: normal appearing vagina with normal color and discharge, no lesions ?             Cervix: no lesions ?             Pap taken: no ?Bimanual Exam:  Uterus:  normal size, contour, position, consistency, mobility, non-tender ?             Adnexa: no mass, fullness, tenderness ?             Rectal exam: yes.  Confirms. ?             Anus:  normal sphincter tone, no lesions ? ?Chaperone was present for exam:  Kimalexis, CMA. ? ?Assessment:   ?Well woman visit with gynecologic exam. ?FH breast cancer in mother.  Negative BRCA testing.  ?Menopausal atrophy.  ? ?Plan: ?Mammogram  screening discussed. ?Self breast awareness reviewed. ?Pap and HR HPV 2027. ?Guidelines for Calcium, Vitamin D, regular exercise program including cardiovascular and weight bearing exercise. ?Refill of vaginal estrogen cream.  I discussed potential effect on breast cancer.  ?Follow up annually and prn.  ? ?After visit summary provided.  ? ? ? ?

## 2021-08-11 NOTE — Patient Instructions (Signed)
EXERCISE AND DIET:  We recommended that you start or continue a regular exercise program for good health. Regular exercise means any activity that makes your heart beat faster and makes you sweat.  We recommend exercising at least 30 minutes per day at least 3 days a week, preferably 4 or 5.  We also recommend a diet low in fat and sugar.  Inactivity, poor dietary choices and obesity can cause diabetes, heart attack, stroke, and kidney damage, among others.   ? ?ALCOHOL AND SMOKING:  Women should limit their alcohol intake to no more than 7 drinks/beers/glasses of wine (combined, not each!) per week. Moderation of alcohol intake to this level decreases your risk of breast cancer and liver damage. And of course, no recreational drugs are part of a healthy lifestyle.  And absolutely no smoking or even second hand smoke. Most people know smoking can cause heart and lung diseases, but did you know it also contributes to weakening of your bones? Aging of your skin?  Yellowing of your teeth and nails? ? ?CALCIUM AND VITAMIN D:  Adequate intake of calcium and Vitamin D are recommended.  The recommendations for exact amounts of these supplements seem to change often, but generally speaking 600 mg of calcium (either carbonate or citrate) and 800 units of Vitamin D per day seems prudent. Certain women may benefit from higher intake of Vitamin D.  If you are among these women, your doctor will have told you during your visit.   ? ?PAP SMEARS:  Pap smears, to check for cervical cancer or precancers,  have traditionally been done yearly, although recent scientific advances have shown that most women can have pap smears less often.  However, every woman still should have a physical exam from her gynecologist every year. It will include a breast check, inspection of the vulva and vagina to check for abnormal growths or skin changes, a visual exam of the cervix, and then an exam to evaluate the size and shape of the uterus and  ovaries.  And after 58 years of age, a rectal exam is indicated to check for rectal cancers. We will also provide age appropriate advice regarding health maintenance, like when you should have certain vaccines, screening for sexually transmitted diseases, bone density testing, colonoscopy, mammograms, etc.  ? ?MAMMOGRAMS:  All women over 40 years old should have a yearly mammogram. Many facilities now offer a "3D" mammogram, which may cost around $50 extra out of pocket. If possible,  we recommend you accept the option to have the 3D mammogram performed.  It both reduces the number of women who will be called back for extra views which then turn out to be normal, and it is better than the routine mammogram at detecting truly abnormal areas.   ? ?COLONOSCOPY:  Colonoscopy to screen for colon cancer is recommended for all women at age 50.  We know, you hate the idea of the prep.  We agree, BUT, having colon cancer and not knowing it is worse!!  Colon cancer so often starts as a polyp that can be seen and removed at colonscopy, which can quite literally save your life!  And if your first colonoscopy is normal and you have no family history of colon cancer, most women don't have to have it again for 10 years.  Once every ten years, you can do something that may end up saving your life, right?  We will be happy to help you get it scheduled when you are ready.    Be sure to check your insurance coverage so you understand how much it will cost.  It may be covered as a preventative service at no cost, but you should check your particular policy.   ? ?Calcium Content in Foods ?Calcium is the most abundant mineral in the body. Most of the body's calcium supply is stored in bones and teeth. Calcium helps many parts of the body function normally, including: ?Blood and blood vessels. ?Nerves. ?Hormones. ?Muscles. ?Bones and teeth. ?When your calcium stores are low, you may be at risk for low bone mass, bone loss, and broken bones  (fractures). When you get enough calcium, it helps to support strong bones and teeth throughout your life. ?Calcium is especially important for: ?Children during growth spurts. ?Girls during adolescence. ?Women who are pregnant or breastfeeding. ?Women after their menstrual cycle stops (postmenopause). ?Women whose menstrual cycle has stopped due to anorexia nervosa or regular intense exercise. ?People who cannot eat or digest dairy products. ?Vegans. ?Recommended daily amounts of calcium: ?Women (ages 19 to 50): 1,000 mg per day. ?Women (ages 51 and older): 1,200 mg per day. ?Men (ages 19 to 70): 1,000 mg per day. ?Men (ages 71 and older): 1,200 mg per day. ?Women (ages 9 to 18): 1,300 mg per day. ?Men (ages 9 to 18): 1,300 mg per day. ?General information ?Eat foods that are high in calcium. Try to get most of your calcium from food. ?Some people may benefit from taking calcium supplements. Check with your health care provider or diet and nutrition specialist (dietitian) before starting any calcium supplements. Calcium supplements may interact with certain medicines. Too much calcium may cause other health problems, such as constipation and kidney stones. ?For the body to absorb calcium, it needs vitamin D. Sources of vitamin D include: ?Skin exposure to direct sunlight. ?Foods, such as egg yolks, liver, mushrooms, saltwater fish, and fortified milk. ?Vitamin D supplements. Check with your health care provider or dietitian before starting any vitamin D supplements. ?What foods are high in calcium? ?Foods that are high in calcium contain more than 100 milligrams per serving. ?Fruits ?Fortified orange juice or other fruit juice, 300 mg per 8 oz serving. ?Vegetables ?Collard greens, 360 mg per 8 oz serving. ?Kale, 100 mg per 8 oz serving. ?Bok choy, 160 mg per 8 oz serving. ?Grains ?Fortified ready-to-eat cereals, 100 to 1,000 mg per 8 oz serving. ?Fortified frozen waffles, 200 mg in 2 waffles. ?Oatmeal, 140 mg in 1  cup. ?Meats and other proteins ?Sardines, canned with bones, 325 mg per 3 oz serving. ?Salmon, canned with bones, 180 mg per 3 oz serving. ?Canned shrimp, 125 mg per 3 oz serving. ?Baked beans, 160 mg per 4 oz serving. ?Tofu, firm, made with calcium sulfate, 253 mg per 4 oz serving. ?Dairy ?Yogurt, plain, low-fat, 310 mg per 6 oz serving. ?Nonfat milk, 300 mg per 8 oz serving. ?American cheese, 195 mg per 1 oz serving. ?Cheddar cheese, 205 mg per 1 oz serving. ?Cottage cheese 2%, 105 mg per 4 oz serving. ?Fortified soy, rice, or almond milk, 300 mg per 8 oz serving. ?Mozzarella, part skim, 210 mg per 1 oz serving. ?The items listed above may not be a complete list of foods high in calcium. Actual amounts of calcium may be different depending on processing. Contact a dietitian for more information. ?What foods are lower in calcium? ?Foods that are lower in calcium contain 50 mg or less per serving. ?Fruits ?Apple, about 6 mg. ?Banana, about 12 mg. ?Vegetables ?  Lettuce, 19 mg per 2 oz serving. ?Tomato, about 11 mg. ?Grains ?Rice, 4 mg per 6 oz serving. ?Boiled potatoes, 14 mg per 8 oz serving. ?White bread, 6 mg per slice. ?Meats and other proteins ?Egg, 27 mg per 2 oz serving. ?Red meat, 7 mg per 4 oz serving. ?Chicken, 17 mg per 4 oz serving. ?Fish, cod, or trout, 20 mg per 4 oz serving. ?Dairy ?Cream cheese, regular, 14 mg per 1 Tbsp serving. ?Brie cheese, 50 mg per 1 oz serving. ?Parmesan cheese, 70 mg per 1 Tbsp serving. ?The items listed above may not be a complete list of foods lower in calcium. Actual amounts of calcium may be different depending on processing. Contact a dietitian for more information. ?Summary ?Calcium is an important mineral in the body because it affects many functions. Getting enough calcium helps support strong bones and teeth throughout your life. ?Try to get most of your calcium from food. ?Calcium supplements may interact with certain medicines. Check with your health care provider or  dietitian before starting any calcium supplements. ?This information is not intended to replace advice given to you by your health care provider. Make sure you discuss any questions you have with your hea

## 2021-09-17 ENCOUNTER — Encounter (HOSPITAL_BASED_OUTPATIENT_CLINIC_OR_DEPARTMENT_OTHER): Payer: Self-pay | Admitting: Nurse Practitioner

## 2021-09-17 ENCOUNTER — Ambulatory Visit (INDEPENDENT_AMBULATORY_CARE_PROVIDER_SITE_OTHER): Payer: 59 | Admitting: Nurse Practitioner

## 2021-09-17 VITALS — BP 117/78 | HR 65 | Ht 64.0 in | Wt 162.0 lb

## 2021-09-17 DIAGNOSIS — Z1329 Encounter for screening for other suspected endocrine disorder: Secondary | ICD-10-CM | POA: Diagnosis not present

## 2021-09-17 DIAGNOSIS — Z13228 Encounter for screening for other metabolic disorders: Secondary | ICD-10-CM

## 2021-09-17 DIAGNOSIS — Z0000 Encounter for general adult medical examination without abnormal findings: Secondary | ICD-10-CM | POA: Diagnosis not present

## 2021-09-17 DIAGNOSIS — Z130 Encounter for screening for diseases of the blood and blood-forming organs and certain disorders involving the immune mechanism: Secondary | ICD-10-CM

## 2021-09-17 DIAGNOSIS — E785 Hyperlipidemia, unspecified: Secondary | ICD-10-CM | POA: Insufficient documentation

## 2021-09-17 DIAGNOSIS — Z23 Encounter for immunization: Secondary | ICD-10-CM | POA: Diagnosis not present

## 2021-09-17 DIAGNOSIS — Z8249 Family history of ischemic heart disease and other diseases of the circulatory system: Secondary | ICD-10-CM | POA: Insufficient documentation

## 2021-09-17 DIAGNOSIS — R7301 Impaired fasting glucose: Secondary | ICD-10-CM | POA: Insufficient documentation

## 2021-09-17 DIAGNOSIS — Z1321 Encounter for screening for nutritional disorder: Secondary | ICD-10-CM

## 2021-09-17 MED ORDER — ZOSTER VAC RECOMB ADJUVANTED 50 MCG/0.5ML IM SUSR: 0.5000 mL | Freq: Once | INTRAMUSCULAR | 1 refills | Status: AC

## 2021-09-17 NOTE — Assessment & Plan Note (Signed)
New patient in office today.  CPE performed today. No alarm symptoms present at this time.  Will obtain labs today for further evaluation. Review of health maintenance with patient today.  She is due for shingles vaccine.  Patient provided with prescription to have this completed at the pharmacy. Colonoscopy last done approximately 5 years ago at [facility].  We will obtain records today to update her health maintenance.  She is due for a repeat colonoscopy next year.  She will let me know who she would like to schedule with to have that completed. Labs today for complete evaluation of health. Follow-up in 1 year with CPE or sooner if needed.

## 2021-09-17 NOTE — Patient Instructions (Signed)
Thank you for choosing [facility] for your Primary Care needs. I am excited for the opportunity to partner with you to meet your health care goals. It was a pleasure meeting you today!  Recommendations from today's visit: Everything looks very good today!  We will get some labs and make sure that there is nothing concerning. I will be in touch with you after all of the labs have come through and I have had a chance to review them.  Please let me know if you have any concerns in the future.  I have provided you with a printed for the Shingles vaccine If you would like me to send in a referral for colonoscopy, please let me know and we can do that  Information on diet, exercise, and health maintenance recommendations are listed below. This is information to help you be sure you are on track for optimal health and monitoring.   Please look over this and let us know if you have any questions or if you have completed any of the health maintenance outside of [facility] so that we can be sure your records are up to date.  ___________________________________________________________ About Me: I am an Adult-Geriatric Nurse Practitioner with a background in caring for patients for more than 20 years with a strong intensive care background. I provide primary care and sports medicine services to patients age 58 and older within this office. My education had a strong focus on caring for the older adult population, which I am passionate about. I am also the director of the APP Fellowship with [facility].   My desire is to provide you with the best service through preventive medicine and supportive care. I consider you a part of the medical team and value your input. I work diligently to ensure that you are heard and your needs are met in a safe and effective manner. I want you to feel comfortable with me as your provider and want you to know that your health concerns are  important to me.  For your information, our office hours are: Monday, Tuesday, and Thursday 8:00 AM - 5:00 PM Wednesday and Friday 8:00 AM - 12:00 PM.   In my time away from the office I am teaching new APP's within the system and am unavailable, but my partner, Dr. Burnard Bunting is in the office for emergent needs.   If you have questions or concerns, please call our office at [phone] or send [place] a MyChart message and we will respond as quickly as possible.  ____________________________________________________________ MyChart:  For all urgent or time sensitive needs we ask that you please call the office to avoid delays. Our number is [phone]. MyChart is not constantly monitored and due to the large volume of messages a day, replies may take up to 72 business hours.  MyChart Policy: MyChart allows for you to see your visit notes, after visit summary, provider recommendations, lab and tests results, make an appointment, request refills, and contact your provider or the office for non-urgent questions or concerns. Providers are seeing patients during normal business hours and do not have built in time to review MyChart messages.  We ask that you allow a minimum of 3 business days for responses to [employer]. For this reason, please do not send urgent requests through [place]. Please call the office at [phone]. New and ongoing conditions may require a visit. We have virtual and in person visit available for your convenience.  Complex  MyChart concerns may require a visit. Your provider may request you schedule a virtual or in person visit to ensure we are providing the best care possible. MyChart messages sent after 11:00 AM on Friday will not be received by the provider until Monday morning.    Lab and Test Results: You will receive your lab and test results on MyChart as soon as they are completed and results have been sent by the lab or testing facility. Due to this service, you  will receive your results BEFORE your provider.  I review lab and tests results each morning prior to seeing patients. Some results require collaboration with other providers to ensure you are receiving the most appropriate care. For this reason, we ask that you please allow a minimum of 3-5 business days from the time the ALL results have been received for your provider to receive and review lab and test results and contact you about these.  Most lab and test result comments from the provider will be sent through [place]. Your provider may recommend changes to the plan of care, follow-up visits, repeat testing, ask questions, or request an office visit to discuss these results. You may reply directly to this message or call the office at [phone] to provide information for the provider or set up an appointment. In some instances, you will be called with test results and recommendations. Please let us know if this is preferred and we will make note of this in your chart to provide this for you.    If you have not heard a response to your lab or test results in 5 business days from all results returning to [place], please call the office to let us know. We ask that you please avoid calling prior to this time unless there is an emergent concern. Due to high call volumes, this can delay the resulting process.  After Hours: For all non-emergency after hours needs, please call the office at [phone] and select the option to reach the on-call provider service. On-call services are shared between multiple [place] offices and therefore it will not be possible to speak directly with your provider. On-call providers may provide medical advice and recommendations, but are unable to provide refills for maintenance medications.  For all emergency or urgent medical needs after normal business hours, we recommend that you seek care at the closest Urgent Care or Emergency Department to ensure appropriate  treatment in a timely manner.  [facility] has a 24 hour emergency room located on the ground floor for your convenience.   Urgent Concerns During the Business Day Providers are seeing patients from 8AM to [place] with a busy schedule and are most often not able to respond to non-urgent calls until the end of the day or the next business day. If you should have URGENT concerns during the day, please call and speak to the nurse or schedule a same day appointment so that we can address your concern without delay.   Thank you, again, for choosing me as your health care partner. I appreciate your trust and look forward to learning more about you.   Tanya Keeler, DNP, AGNP-c ___________________________________________________________  Health Maintenance Recommendations Screening Testing Mammogram Every 1 -2 years based on history and risk factors Starting at age 59 Pap Smear Ages 21-39 every 3 years Ages 67-65 every 5 years with HPV testing More frequent testing may be required based on results and history Colon Cancer Screening Every 1-10 years based on  test performed, risk factors, and history Starting at age 45 Bone Density Screening Every 2-10 years based on history Starting at age 44 for women Recommendations for men differ based on medication usage, history, and risk factors AAA Screening One time ultrasound Men 51-59 years old who have every smoked Lung Cancer Screening Low Dose Lung CT every 12 months Age 35-80 years with a 30 pack-year smoking history who still smoke or who have quit within the last 15 years  Screening Labs Routine  Labs: Complete Blood Count (CBC), Complete Metabolic Panel (CMP), Cholesterol (Lipid Panel) Every 6-12 months based on history and medications May be recommended more frequently based on current conditions or previous results Hemoglobin A1c Lab Every 3-12 months based on history and previous results Starting at age 81 or  earlier with diagnosis of diabetes, high cholesterol, BMI >26, and/or risk factors Frequent monitoring for patients with diabetes to ensure blood sugar control Thyroid Panel (TSH w/ T3 & T4) Every 6 months based on history, symptoms, and risk factors May be repeated more often if on medication HIV One time testing for all patients 97 and older May be repeated more frequently for patients with increased risk factors or exposure Hepatitis C One time testing for all patients 61 and older May be repeated more frequently for patients with increased risk factors or exposure Gonorrhea, Chlamydia Every 12 months for all sexually active persons 13-24 years Additional monitoring may be recommended for those who are considered high risk or who have symptoms PSA Men 44-66 years old with risk factors Additional screening may be recommended from age 38-69 based on risk factors, symptoms, and history  Vaccine Recommendations Tetanus Booster All adults every 10 years Flu Vaccine All patients 6 months and older every year COVID Vaccine All patients 12 years and older Initial dosing with booster May recommend additional booster based on age and health history HPV Vaccine 2 doses all patients age 4-26 Dosing may be considered for patients over 26 Shingles Vaccine (Shingrix) 2 doses all adults 12 years and older Pneumonia (Pneumovax 23) All adults 41 years and older May recommend earlier dosing based on health history Pneumonia (Prevnar 58) All adults 4 years and older Dosed 1 year after Pneumovax 23  Additional Screening, Testing, and Vaccinations may be recommended on an individualized basis based on family history, health history, risk factors, and/or exposure.  __________________________________________________________  Diet Recommendations for All Patients  I recommend that all patients maintain a diet low in saturated fats, carbohydrates, and cholesterol. While this can be challenging  at first, it is not impossible and small changes can make big differences.  Things to try: Decreasing the amount of soda, sweet tea, and/or juice to one or less per day and replace with water While water is always the first choice, if you do not like water you may consider adding a water additive without sugar to improve the taste other sugar free drinks Replace potatoes with a brightly colored vegetable at dinner Use healthy oils, such as canola oil or olive oil, instead of butter or hard margarine Limit your bread intake to two pieces or less a day Replace regular pasta with low carb pasta options Bake, broil, or grill foods instead of frying Monitor portion sizes  Eat smaller, more frequent meals throughout the day instead of large meals  An important thing to remember is, if you love foods that are not great for your health, you don't have to give them up completely. Instead, allow these foods  to be a reward when you have done well. Allowing yourself to still have special treats every once in a while is a nice way to tell yourself thank you for working hard to keep yourself healthy.   Also remember that every day is a new day. If you have a bad day and "fall off the wagon", you can still climb right back up and keep moving along on your journey!  We have resources available to help you!  Some websites that may be helpful include: www.http://carter.biz/  Www.VeryWellFit.com _____________________________________________________________  Activity Recommendations for All Patients  I recommend that all adults get at least 20 minutes of moderate physical activity that elevates your heart rate at least 5 days out of the week.  Some examples include: Walking or jogging at a pace that allows you to carry on a conversation Cycling (stationary bike or outdoors) Water aerobics Yoga Weight lifting Dancing If physical limitations prevent you from putting stress on your joints, exercise in a pool or  seated in a chair are excellent options.  Do determine your MAXIMUM heart rate for activity: YOUR AGE - 220 = MAX HeartRate   Remember! Do not push yourself too hard.  Start slowly and build up your pace, speed, weight, time in exercise, etc.  Allow your body to rest between exercise and get good sleep. You will need more water than normal when you are exerting yourself. Do not wait until you are thirsty to drink. Drink with a purpose of getting in at least 8, 8 ounce glasses of water a day plus more depending on how much you exercise and sweat.    If you begin to develop dizziness, chest pain, abdominal pain, jaw pain, shortness of breath, headache, vision changes, lightheadedness, or other concerning symptoms, stop the activity and allow your body to rest. If your symptoms are severe, seek emergency evaluation immediately. If your symptoms are concerning, but not severe, please let us know so that we can recommend further evaluation.

## 2021-09-17 NOTE — Assessment & Plan Note (Signed)
History of elevated lipids with previous PCP.  Not currently taking any medication for this.  Patient does request if lipids come back elevated she would like to try Vascepa as opposed to a statin medication. We will plan to monitor lab results and determine if further management is needed at this time.

## 2021-09-17 NOTE — Progress Notes (Signed)
Orma Render, DNP, AGNP-c [facility] [address]  [place], [place] [address] [phone]  New patient visit   Patient: Tanya Andrews   DOB: 1965/07/26   56 y.o. Female  MRN: [id number] Visit Date: 09/17/2021  Patient Care Team: Orma Render, NP as PCP - General (Nurse Practitioner)  Today's Vitals   09/17/21 0806  BP: 117/78  Pulse: 65  SpO2: 96%  Weight: 162 lb (73.5 kg)  Height: $Remove'5\' 4"'zljHEAi$  (8.984 m)   Body mass index is 27.81 kg/m.   Today's healthcare provider: Orma Render, NP   Chief Complaint  Patient presents with   Establish Care   Subjective    Tanya Andrews is a 56 y.o. female who presents today as a new patient to establish care.    Patient endorses the following concerns presently: History of Back Issues - takes skelaxin very infrequently for severe pain and muscle spasms - injury in 2020 - working with yoga, stretching daily, walking, weight lighting - has only had two recurrences since the initial injury - the episodes will take her down for about a week, but the medications will help significantly  Intermittent Fasting - starts around 1pm and makes sure she stops eating by about 7pm - feels that this is helping with her weight along with routine exercise - she eats reduced sugar and avoid processed foods  She is married with no children. She eats a very healthy diet and works out daily.   History reviewed and reveals the following: 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   Toe fracture    late 20s   Past Surgical History:  Procedure Laterality Date   NO PAST SURGERIES     Family Status  Relation Name Status   Father  Alive   PGF  Deceased at age 49s   Mother  Alive   Brother  Alive   Mat 44  Alive   Sister  Alive   MGM  Deceased   MGF  Deceased   PGM  Deceased   Family History  Problem Relation Age of Onset    Angina Father 62   Heart disease Father    Heart attack Paternal Grandfather    Breast cancer Mother        4 & 86 had lumpectomy first time then mastectomy--Neg BRCA   Diabetes Mother        diet controlled   Hypertension Mother    Social History   Socioeconomic History   Marital status: Married    Spouse name: Not on file   Number of children: Not on file   Years of education: Not on file   Highest education level: Not on file  Occupational History   Not on file  Tobacco Use   Smoking status: Never   Smokeless tobacco: Never  Vaping Use   Vaping Use: Never used  Substance and Sexual Activity   Alcohol use: Yes    Alcohol/week: 5.0 standard drinks    Types: 5 Glasses of wine per week   Drug use: Never   Sexual activity: Yes    Birth control/protection: Post-menopausal  Other Topics Concern   Not on file  Social History Narrative   Not on file   Social Determinants of Health   Financial Resource Strain: Not on file  Food Insecurity: Not on file  Transportation Needs: Not on  file  Physical Activity: Not on file  Stress: Not on file  Social Connections: Not on file   Outpatient Medications Prior to Visit  Medication Sig   estradiol (ESTRACE) 0.1 MG/GM vaginal cream Place 1/2 gram per vagina at bedtime 2- 3 times per week.   Krill Oil Omega-3 500 MG CAPS 2 capsules   magnesium 30 MG tablet Take 30 mg by mouth 2 (two) times daily.   metaxalone (SKELAXIN) 800 MG tablet metaxalone 800 mg tablet  Take 1 tablet 3 times a day by oral route.   naproxen (NAPROSYN) 500 MG tablet naproxen 500 mg tablet  Take 1 tablet twice a day by oral route.   [DISCONTINUED] OVER THE COUNTER MEDICATION Nutrfil Optimal-M (Patient not taking: Reported on 09/17/2021)   [DISCONTINUED] UNABLE TO FIND Med Name: Optimal V Takes 1 tablet daily (Patient not taking: Reported on 09/17/2021)   No facility-administered medications prior to visit.   Allergies  Allergen Reactions   Erythromycin  Nausea Only   Prednisone Other (See Comments)  Prednisone gives her vertigo- she can take if necessary Immunization History  Administered Date(s) Administered   Influenza Inj Mdck Quad With Preservative 01/01/2018, 02/14/2019   Influenza-Unspecified 02/01/2016   Moderna Sars-Covid-2 Vaccination 06/13/2019, 07/11/2019   Tdap 08/15/2014    Review of Systems All review of systems negative except what is listed in the HPI   Objective    BP 117/78   Pulse 65   Ht $R'5\' 4"'MV$  (1.626 m)   Wt 162 lb (73.5 kg)   LMP 05/04/2015 (Approximate)   SpO2 96%   BMI 27.81 kg/m  Physical Exam Vitals and nursing note reviewed.  Constitutional:      General: She is not in acute distress.    Appearance: Normal appearance.  HENT:     Head: Normocephalic and atraumatic.     Right Ear: Hearing, tympanic membrane, ear canal and external ear normal.     Left Ear: Hearing, tympanic membrane, ear canal and external ear normal.     Nose: Nose normal.     Right Sinus: No maxillary sinus tenderness or frontal sinus tenderness.     Left Sinus: No maxillary sinus tenderness or frontal sinus tenderness.     Mouth/Throat:     Lips: Pink.     Mouth: Mucous membranes are moist.     Pharynx: Oropharynx is clear.  Eyes:     General: Lids are normal. Vision grossly intact.     Extraocular Movements: Extraocular movements intact.     Conjunctiva/sclera: Conjunctivae normal.     Pupils: Pupils are equal, round, and reactive to light.     Funduscopic exam:    Right eye: Red reflex present.        Left eye: Red reflex present.    Visual Fields: Right eye visual fields normal and left eye visual fields normal.  Neck:     Thyroid: No thyromegaly.     Vascular: No carotid bruit.  Cardiovascular:     Rate and Rhythm: Normal rate and regular rhythm.     Chest Wall: PMI is not displaced.     Pulses: Normal pulses.          Dorsalis pedis pulses are 2+ on the right side and 2+ on the left side.       Posterior tibial  pulses are 2+ on the right side and 2+ on the left side.     Heart sounds: Normal heart sounds. No murmur heard. Pulmonary:  Effort: Pulmonary effort is normal. No respiratory distress.     Breath sounds: Normal breath sounds.  Abdominal:     General: Abdomen is flat. Bowel sounds are normal. There is no distension.     Palpations: Abdomen is soft. There is no hepatomegaly, splenomegaly or mass.     Tenderness: There is no abdominal tenderness. There is no right CVA tenderness, left CVA tenderness, guarding or rebound.  Musculoskeletal:        General: Normal range of motion.     Cervical back: Full passive range of motion without pain, normal range of motion and neck supple. No tenderness.     Right lower leg: No edema.     Left lower leg: No edema.  Feet:     Left foot:     Toenail Condition: Left toenails are normal.  Lymphadenopathy:     Cervical: No cervical adenopathy.     Upper Body:     Right upper body: No supraclavicular adenopathy.     Left upper body: No supraclavicular adenopathy.  Skin:    General: Skin is warm and dry.     Capillary Refill: Capillary refill takes less than 2 seconds.     Nails: There is no clubbing.  Neurological:     General: No focal deficit present.     Mental Status: She is alert and oriented to person, place, and time.     GCS: GCS eye subscore is 4. GCS verbal subscore is 5. GCS motor subscore is 6.     Sensory: Sensation is intact.     Motor: Motor function is intact.     Coordination: Coordination is intact.     Gait: Gait is intact.     Deep Tendon Reflexes: Reflexes are normal and symmetric.  Psychiatric:        Attention and Perception: Attention normal.        Mood and Affect: Mood normal.        Speech: Speech normal.        Behavior: Behavior normal. Behavior is cooperative.        Thought Content: Thought content normal.        Cognition and Memory: Cognition and memory normal.        Judgment: Judgment normal.     No  results found for any visits on 09/17/21.  Assessment & Plan      Problem List Items Addressed This Visit     Wellness examination - Primary    New patient in office today.  CPE performed today. No alarm symptoms present at this time.  Will obtain labs today for further evaluation. Review of health maintenance with patient today.  She is due for shingles vaccine.  Patient provided with prescription to have this completed at the pharmacy. Colonoscopy last done approximately 5 years ago at [facility].  We will obtain records today to update her health maintenance.  She is due for a repeat colonoscopy next year.  She will let me know who she would like to schedule with to have that completed. Labs today for complete evaluation of health. Follow-up in 1 year with CPE or sooner if needed.       Hyperlipidemia    History of elevated lipids with previous PCP.  Not currently taking any medication for this.  Patient does request if lipids come back elevated she would like to try Vascepa as opposed to a statin medication. We will plan to monitor lab results and determine if  further management is needed at this time.       Other Visit Diagnoses     Need for shingles vaccine       Relevant Medications   Zoster Vaccine Adjuvanted [facility]) injection   Screening for endocrine, nutritional, metabolic and immunity disorder       Relevant Orders   Hemoglobin A1c   VITAMIN D 25 Hydroxy (Vit-D Deficiency, Fractures)   TSH   Lipid panel   Comprehensive metabolic panel   CBC with Differential/Platelet      Colonoscopy about 5 years ago with [facility] (Dr. B). Due in near future. She will let me know where she would like to go for the next. Due for shingles vaccines.   Return in about 1 year (around 09/18/2022) for CPE today and CPE f/u in 1 year.      Salina Stanfield, Coralee Pesa, NP, DNP, AGNP-C [facility] at [facility]

## 2021-09-18 LAB — COMPREHENSIVE METABOLIC PANEL
ALT: 17 IU/L (ref 0–32)
AST: 12 IU/L (ref 0–40)
Albumin/Globulin Ratio: 1.7 (ref 1.2–2.2)
Albumin: 4.6 g/dL (ref 3.8–4.9)
Alkaline Phosphatase: 75 IU/L (ref 44–121)
BUN/Creatinine Ratio: 19 (ref 9–23)
BUN: 14 mg/dL (ref 6–24)
Bilirubin Total: 0.4 mg/dL (ref 0.0–1.2)
CO2: 24 mmol/L (ref 20–29)
Calcium: 9.5 mg/dL (ref 8.7–10.2)
Chloride: 103 mmol/L (ref 96–106)
Creatinine, Ser: 0.72 mg/dL (ref 0.57–1.00)
Globulin, Total: 2.7 g/dL (ref 1.5–4.5)
Glucose: 111 mg/dL — ABNORMAL HIGH (ref 70–99)
Potassium: 4.8 mmol/L (ref 3.5–5.2)
Sodium: 141 mmol/L (ref 134–144)
Total Protein: 7.3 g/dL (ref 6.0–8.5)
eGFR: 97 mL/min/{1.73_m2} (ref >59)

## 2021-09-18 LAB — CBC WITH DIFFERENTIAL/PLATELET
Basophils Absolute: 0.1 10*3/uL (ref 0.0–0.2)
Basos: 1 %
EOS (ABSOLUTE): 0.1 10*3/uL (ref 0.0–0.4)
Eos: 2 %
Hematocrit: 40.3 % (ref 34.0–46.6)
Hemoglobin: 13.6 g/dL (ref 11.1–15.9)
Immature Grans (Abs): 0 10*3/uL (ref 0.0–0.1)
Immature Granulocytes: 1 %
Lymphocytes Absolute: 2.1 10*3/uL (ref 0.7–3.1)
Lymphs: 51 %
MCH: 32.5 pg (ref 26.6–33.0)
MCHC: 33.7 g/dL (ref 31.5–35.7)
MCV: 96 fL (ref 79–97)
Monocytes Absolute: 0.4 10*3/uL (ref 0.1–0.9)
Monocytes: 9 %
Neutrophils Absolute: 1.5 10*3/uL (ref 1.4–7.0)
Neutrophils: 36 %
Platelets: 255 10*3/uL (ref 150–450)
RBC: 4.19 x10E6/uL (ref 3.77–5.28)
RDW: 11.9 % (ref 11.7–15.4)
WBC: 4.1 10*3/uL (ref 3.4–10.8)

## 2021-09-18 LAB — TSH: TSH: 2.49 u[IU]/mL (ref 0.450–4.500)

## 2021-09-18 LAB — LIPID PANEL
Chol/HDL Ratio: 2.8 ratio (ref 0.0–4.4)
Cholesterol, Total: 287 mg/dL — ABNORMAL HIGH (ref 100–199)
HDL: 102 mg/dL (ref >39)
LDL Chol Calc (NIH): 175 mg/dL — ABNORMAL HIGH (ref 0–99)
Triglycerides: 68 mg/dL (ref 0–149)
VLDL Cholesterol Cal: 10 mg/dL (ref 5–40)

## 2021-09-18 LAB — HEMOGLOBIN A1C
Est. average glucose Bld gHb Est-mCnc: 111 mg/dL
Hgb A1c MFr Bld: 5.5 % (ref 4.8–5.6)

## 2021-09-18 LAB — VITAMIN D 25 HYDROXY (VIT D DEFICIENCY, FRACTURES): Vit D, 25-Hydroxy: 52.7 ng/mL (ref 30.0–100.0)

## 2022-04-02 IMAGING — MG MM DIGITAL SCREENING BILAT W/ TOMO AND CAD
8 series · 8 of 24 positions shown · non-contrast
Comparison: Previous exam(s).

CLINICAL DATA: Screening.

EXAM:
DIGITAL SCREENING BILATERAL MAMMOGRAM WITH TOMOSYNTHESIS AND CAD
TECHNIQUE: Bilateral screening digital craniocaudal and mediolateral oblique
mammograms were obtained. Bilateral screening digital breast
tomosynthesis was performed. The images were evaluated with
computer-aided detection.

[L CC synth-2D]
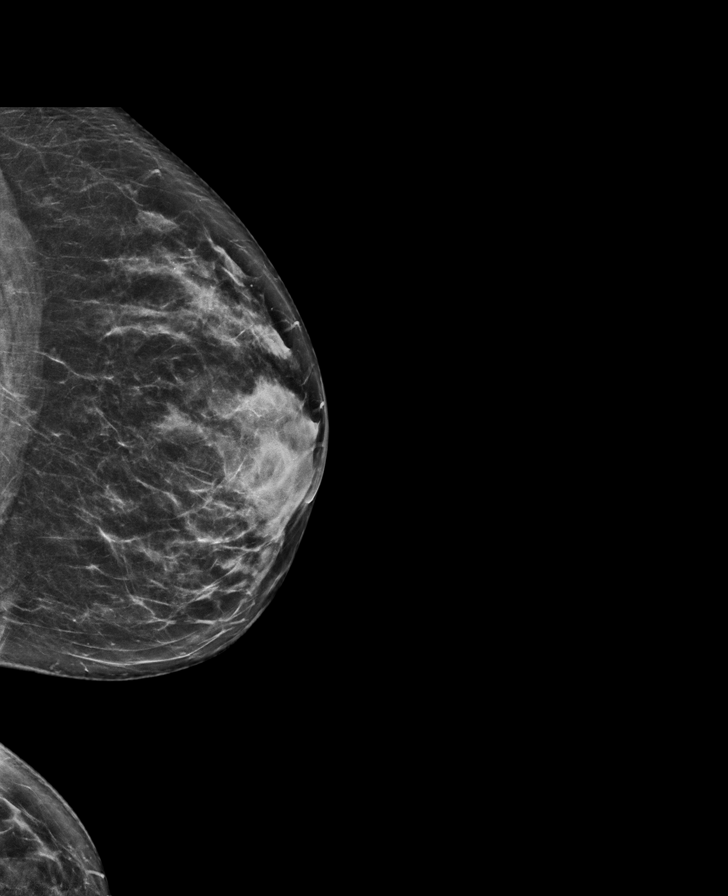

[R CC synth-2D]
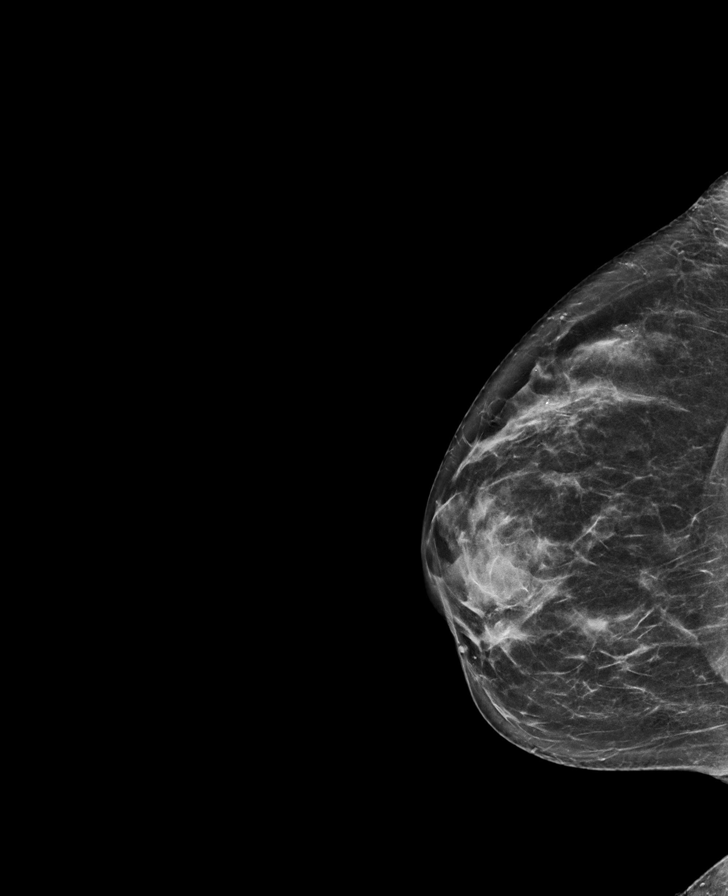

[L MLO synth-2D]
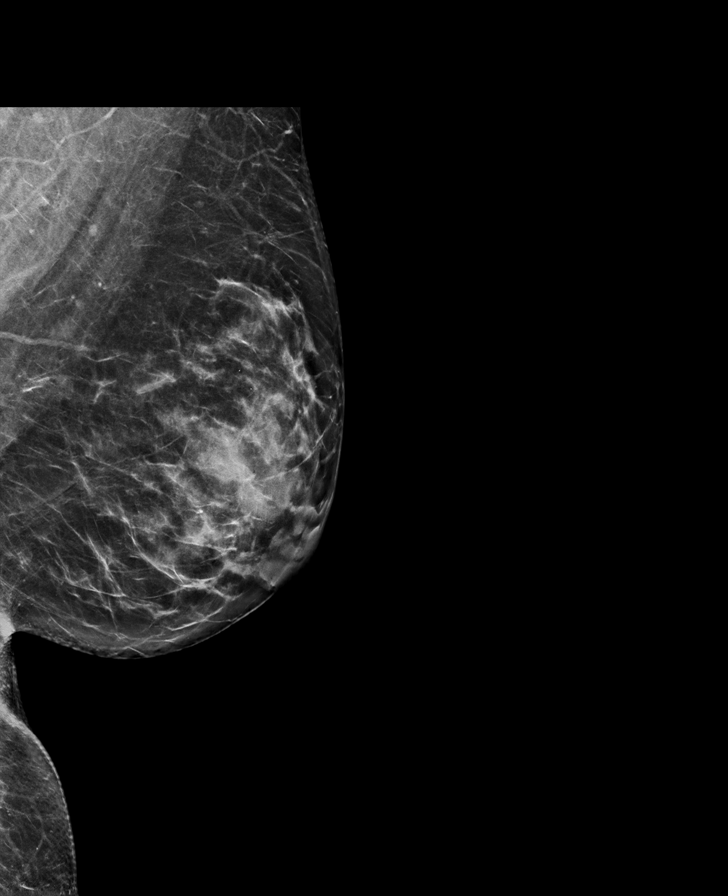

[R MLO synth-2D]
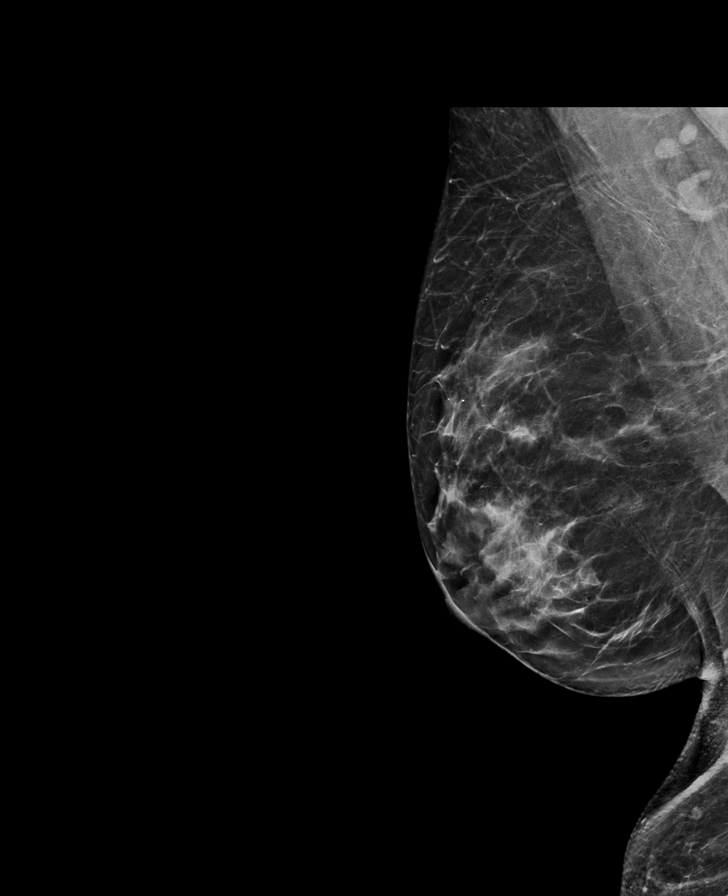

[R MLO tomo · tomo slice 39/76.0]
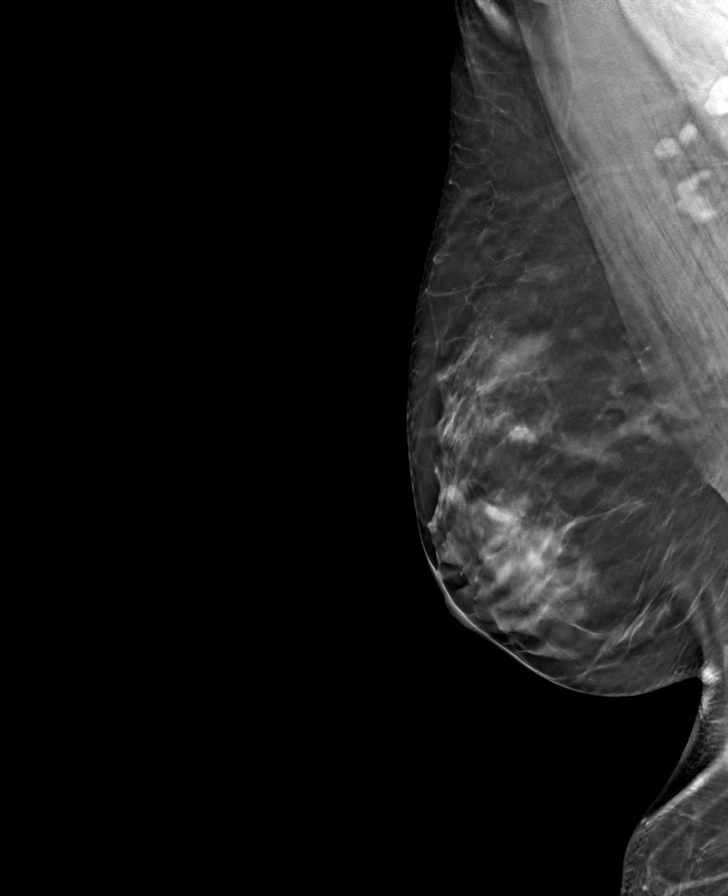

[L CC tomo · tomo slice 34/67.0]
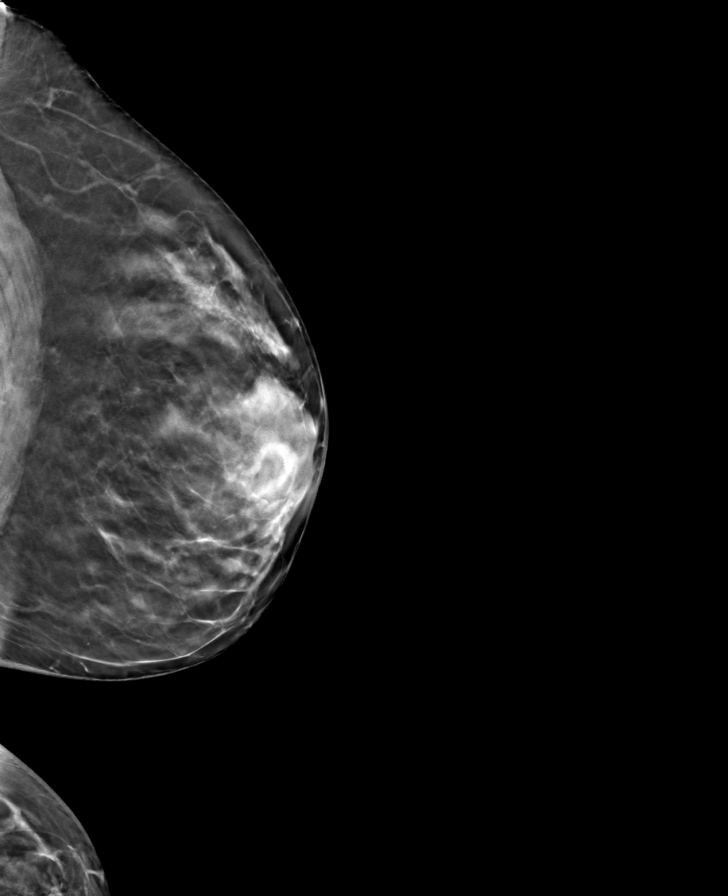

[R CC tomo · tomo slice 33/65.0]
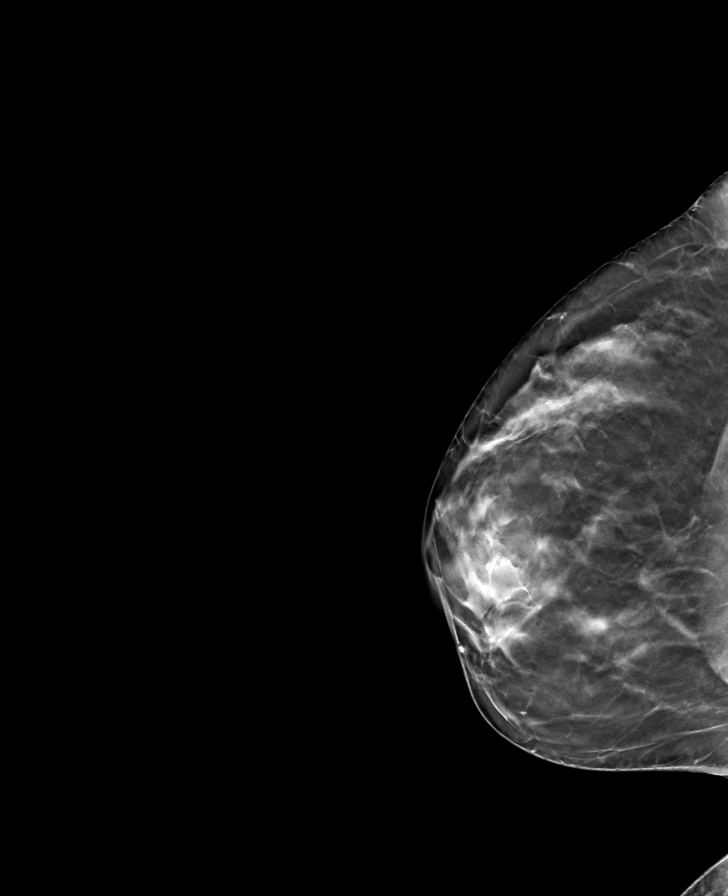

[L MLO tomo · tomo slice 37/74.0]
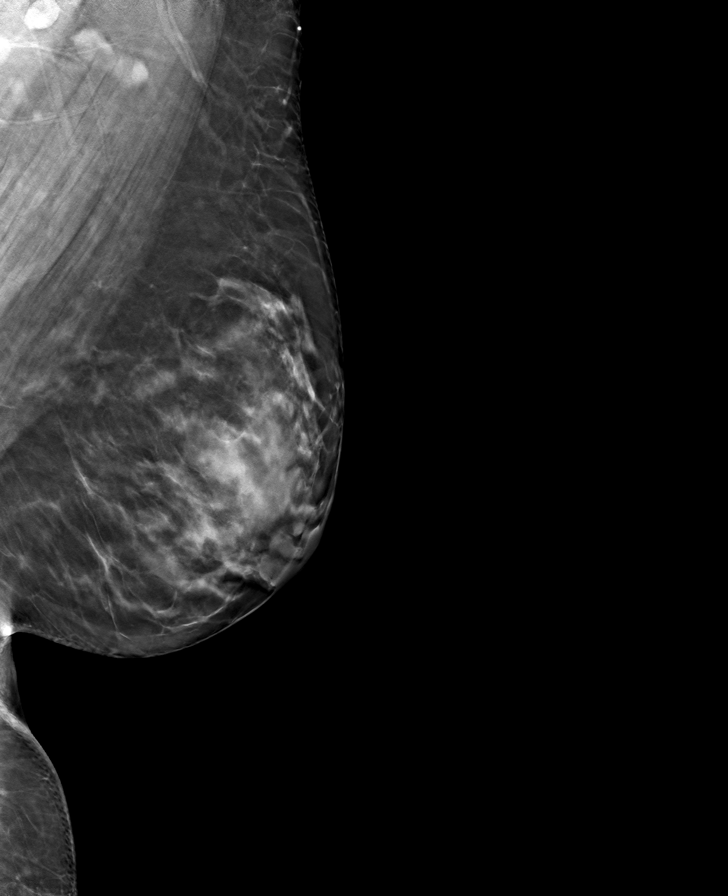

[8 of 24 positions shown; findings below may reference images not displayed]

ACR Breast Density Category c: The breast tissue is heterogeneously
dense, which may obscure small masses.
FINDINGS: There are no findings suspicious for malignancy.
IMPRESSION: No mammographic evidence of malignancy. A result letter of this
screening mammogram will be mailed directly to the patient.

RECOMMENDATION:
Screening mammogram in one year. (Code:Q3-[id number])

BI-RADS CATEGORY  1: Negative.

## 2022-04-10 ENCOUNTER — Emergency Department (HOSPITAL_BASED_OUTPATIENT_CLINIC_OR_DEPARTMENT_OTHER)
Admission: EM | Admit: 2022-04-10 | Discharge: 2022-04-10 | Disposition: A | Discharge status: Home / Self Care | Payer: 59 | Attending: Emergency Medicine | Admitting: Emergency Medicine

## 2022-04-10 ENCOUNTER — Other Ambulatory Visit: Payer: Self-pay

## 2022-04-10 ENCOUNTER — Other Ambulatory Visit (HOSPITAL_BASED_OUTPATIENT_CLINIC_OR_DEPARTMENT_OTHER): Payer: Self-pay

## 2022-04-10 ENCOUNTER — Emergency Department (HOSPITAL_BASED_OUTPATIENT_CLINIC_OR_DEPARTMENT_OTHER): Payer: 59

## 2022-04-10 DIAGNOSIS — R1084 Generalized abdominal pain: Secondary | ICD-10-CM | POA: Insufficient documentation

## 2022-04-10 DIAGNOSIS — R1031 Right lower quadrant pain: Secondary | ICD-10-CM | POA: Diagnosis present

## 2022-04-10 LAB — URINALYSIS, ROUTINE W REFLEX MICROSCOPIC
Bilirubin Urine: NEGATIVE
Glucose, UA: NEGATIVE mg/dL
Hgb urine dipstick: NEGATIVE
Ketones, ur: NEGATIVE mg/dL
Nitrite: NEGATIVE
Protein, ur: NEGATIVE mg/dL
Specific Gravity, Urine: 1.019 (ref 1.005–1.030)
pH: 7 (ref 5.0–8.0)

## 2022-04-10 LAB — COMPREHENSIVE METABOLIC PANEL
ALT: 13 U/L (ref 0–44)
AST: 11 U/L — ABNORMAL LOW (ref 15–41)
Albumin: 4.7 g/dL (ref 3.5–5.0)
Alkaline Phosphatase: 67 U/L (ref 38–126)
Anion gap: 10 (ref 5–15)
BUN: 24 mg/dL — ABNORMAL HIGH (ref 6–20)
CO2: 28 mmol/L (ref 22–32)
Calcium: 9.7 mg/dL (ref 8.9–10.3)
Chloride: 102 mmol/L (ref 98–111)
Creatinine, Ser: 0.62 mg/dL (ref 0.44–1.00)
GFR, Estimated: >60 mL/min (ref >60)
Glucose, Bld: 122 mg/dL — ABNORMAL HIGH (ref 70–99)
Potassium: 4.2 mmol/L (ref 3.5–5.1)
Sodium: 140 mmol/L (ref 135–145)
Total Bilirubin: 0.4 mg/dL (ref 0.3–1.2)
Total Protein: 7.9 g/dL (ref 6.5–8.1)

## 2022-04-10 LAB — CBC
HCT: 40.2 % (ref 36.0–46.0)
Hemoglobin: 13.8 g/dL (ref 12.0–15.0)
MCH: 33.1 pg (ref 26.0–34.0)
MCHC: 34.3 g/dL (ref 30.0–36.0)
MCV: 96.4 fL (ref 80.0–100.0)
Platelets: 272 10*3/uL (ref 150–400)
RBC: 4.17 MIL/uL (ref 3.87–5.11)
RDW: 11.6 % (ref 11.5–15.5)
WBC: 6.1 10*3/uL (ref 4.0–10.5)
nRBC: 0 % (ref 0.0–0.2)

## 2022-04-10 LAB — LIPASE, BLOOD: Lipase: 27 U/L (ref 11–51)

## 2022-04-10 LAB — PREGNANCY, URINE: Preg Test, Ur: NEGATIVE

## 2022-04-10 MED ORDER — MELOXICAM 15 MG PO TABS
15.0000 mg | ORAL_TABLET | Freq: Every day | ORAL | 0 refills | Status: AC
  Filled 2022-04-10: qty 30, 30d supply, fill #0

## 2022-04-10 MED ORDER — MELOXICAM 15 MG PO TABS: 15.0000 mg | ORAL_TABLET | Freq: Every day | ORAL | 0 refills | Status: DC

## 2022-04-10 MED ORDER — CEPHALEXIN 500 MG PO CAPS
500.0000 mg | ORAL_CAPSULE | Freq: Two times a day (BID) | ORAL | 0 refills | Status: DC
  Filled 2022-04-10: qty 10, 5d supply, fill #0

## 2022-04-10 MED ORDER — IOHEXOL 300 MG/ML  SOLN
100.0000 mL | Freq: Once | INTRAMUSCULAR | Status: AC | PRN
  Administered 2022-04-10: 80 mL via INTRAVENOUS

## 2022-04-10 MED ORDER — CEPHALEXIN 500 MG PO CAPS: 500.0000 mg | ORAL_CAPSULE | Freq: Two times a day (BID) | ORAL | 0 refills | Status: DC

## 2022-04-10 NOTE — ED Triage Notes (Signed)
Patient arrives ambulatory to room with complaints of radiating abdominal pain going into her back. More pain on the right side of her abdomen. Rates pain a 4/10.  No vomiting or diarrhea, some nausea.

## 2022-04-10 NOTE — ED Provider Notes (Signed)
[facility] EMERGENCY DEPT Provider Note   CSN: [id number] Arrival date & time: 04/10/22  4403     History Chief Complaint  Patient presents with   Abdominal Pain    HPI Tanya Andrews is a 57 y.o. female presenting for chief complaint of right lower quadrant abdominal pain.  She states that over the last 4 days she has had intermittent right lower quadrant pain.  It is worse when she stands up and when she is ambulatory.  Has been progressive over the past 2 days.  She endorses no abdominal surgical history.  She is having bowel movements.  She denies fevers or chills but endorses intermittent nausea.  No vomiting.  No diarrhea. No known sick contacts. States that she is worried about appendicitis because of a family contact with appendicitis very similar to her presentation.   Patient's recorded medical, surgical, social, medication list and allergies were reviewed in the Snapshot window as part of the initial history.   Review of Systems   Review of Systems  Constitutional:  Negative for chills and fever.  HENT:  Negative for ear pain and sore throat.   Eyes:  Negative for pain and visual disturbance.  Respiratory:  Negative for cough and shortness of breath.   Cardiovascular:  Negative for chest pain and palpitations.  Gastrointestinal:  Positive for abdominal pain and nausea. Negative for vomiting.  Genitourinary:  Negative for dysuria and hematuria.  Musculoskeletal:  Negative for arthralgias and back pain.  Skin:  Negative for color change and rash.  Neurological:  Negative for seizures and syncope.  All other systems reviewed and are negative.   Physical Exam Updated Vital Signs BP (!) 141/69 (BP Location: Right Arm)   Pulse 80   Temp 98.3 F (36.8 C) (Oral)   Resp 20   Ht 5\' 4"  (1.626 m)   Wt 73.5 kg   LMP 05/04/2015 (Approximate)   SpO2 100%   BMI 27.81 kg/m  Physical Exam Vitals and nursing note reviewed.  Constitutional:       General: She is not in acute distress.    Appearance: She is well-developed.  HENT:     Head: Normocephalic and atraumatic.  Eyes:     Conjunctiva/sclera: Conjunctivae normal.  Cardiovascular:     Rate and Rhythm: Normal rate and regular rhythm.     Heart sounds: No murmur heard. Pulmonary:     Effort: Pulmonary effort is normal. No respiratory distress.     Breath sounds: Normal breath sounds.  Abdominal:     General: There is no distension.     Palpations: Abdomen is soft.     Tenderness: There is no abdominal tenderness. There is no right CVA tenderness or left CVA tenderness.  Musculoskeletal:        General: No swelling or tenderness. Normal range of motion.     Cervical back: Neck supple.  Skin:    General: Skin is warm and dry.  Neurological:     General: No focal deficit present.     Mental Status: She is alert and oriented to person, place, and time. Mental status is at baseline.     Cranial Nerves: No cranial nerve deficit.      ED Course/ Medical Decision Making/ A&P Clinical Course as of 04/10/22 1152  Sat Apr 10, 2022  1118 CT ABDOMEN PELVIS W CONTRAST [CC]    Clinical Course User Index [CC] Apr 12, 2022, MD    Procedures Procedures   Medications Ordered in ED  Medications  iohexol (OMNIPAQUE) 300 MG/ML solution 100 mL (80 mLs Intravenous Contrast Given 04/10/22 1005)    Medical Decision Making:   Tanya Andrews is a 56 y.o. female who presented to the ED today with abdominal pain, detailed above.    Patient placed on continuous vitals and telemetry monitoring while in ED which was reviewed periodically.  Complete initial physical exam performed, notably the patient  was hemodynamically stable in no acute distress mild abdominal tenderness at this time worse when standing.     Reviewed and confirmed nursing documentation for past medical history, family history, social history.    Initial Assessment:   With the patient's presentation of  abdominal pain, most likely diagnosis is nonspecific musculoskeletal pain. Other diagnoses were considered including (but not limited to) gastroenteritis, colitis, small bowel obstruction, appendicitis, cholecystitis, pancreatitis, nephrolithiasis, UTI, pyleonephritis, ovarian torsion. These are considered less likely due to history of present illness and physical exam findings.   This is most consistent with an acute life/limb threatening illness complicated by underlying chronic conditions.   Initial Plan:  CBC/CMP to evaluate for underlying infectious/metabolic etiology for patient's abdominal pain  Lipase to evaluate for pancreatitis  CTAB/Pelvis with contrast to evaluate for structural/surgical etiology of patients' severe abdominal pain.  Urinalysis and repeat physical assessment to evaluate for UTI/Pyelonpehritis  Empiric management of symptoms with escalating pain control and antiemetics as needed.   Initial Study Results:   Laboratory  All laboratory results reviewed without evidence of clinically relevant pathology.   Radiology All images reviewed independently. Agree with radiology report at this time.   CT ABDOMEN PELVIS W CONTRAST  Result Date: 04/10/2022 CLINICAL DATA:  Abdominal pain. EXAM: CT ABDOMEN AND PELVIS WITH CONTRAST TECHNIQUE: Multidetector CT imaging of the abdomen and pelvis was performed using the standard protocol following bolus administration of intravenous contrast. RADIATION DOSE REDUCTION: This exam was performed according to the departmental dose-optimization program which includes automated exposure control, adjustment of the mA and/or kV according to patient size and/or use of iterative reconstruction technique. CONTRAST:  64mL OMNIPAQUE IOHEXOL 300 MG/ML  SOLN COMPARISON:  None Available. FINDINGS: Lower chest: No acute abnormality. Hepatobiliary: No focal liver abnormality is seen. No gallstones, gallbladder wall thickening, or biliary dilatation. Pancreas:  Unremarkable. No pancreatic ductal dilatation or surrounding inflammatory changes. Spleen: Normal in size without focal abnormality. Adrenals/Urinary Tract: Normal adrenal glands. Kidneys normal in size, orientation and position with symmetric enhancement and excretion. 6 mm low-attenuation mass, midpole the left kidney, consistent with a cyst. No follow-up recommended. No other renal masses or lesions, no stones and no hydronephrosis. Normal ureters. Normal bladder. Stomach/Bowel: Normal stomach. Small bowel and colon are normal in caliber. No wall thickening. No inflammation. Normal appendix visualized. Vascular/Lymphatic: No significant vascular findings are present. No enlarged abdominal or pelvic lymph nodes. Reproductive: Uterus and bilateral adnexa are unremarkable. Other: No abdominal wall hernia or abnormality. No abdominopelvic ascites. Musculoskeletal: Focus of sclerosis in the right ilium just above the right acetabulum, benign/nonaggressive in appearance, consistent with a large bone island. IMPRESSION: 1. No acute findings. No findings to account for the patient's abdominal pain. Electronically Signed   By: Amie Portland M.D.   On: 04/10/2022 10:24      Final Reassessment and Plan:   After 3 hours in the emergency department, I reassessed patient, her symptoms remain intermittent and mild.  Objective results grossly reassuring.  She does have bacteriuria, leukocyte in urine and reports 1 episode of burning on urination.  Her syndrome  may be defined as a urinary tract infection though is still nonspecific at this time.  I recommended that she wait for culture report and start the Keflex as prescribed if the culture is positive or if her symptoms become worse over the next 48 hours.  I recommend she follow-up with her PCP within 48 hours for reassessment as well. For her pain in her hips, it is likely musculoskeletal, there is an abnormal likely benign lesion identified which I discussed with the  patient.  She states that it was identified before and that she would talk to her PCP about it. Will trial meloxicam for pain control otherwise no acute indication for intervention in the emergency department.  Disposition:  I have considered need for hospitalization, however, considering all of the above, I believe this patient is stable for discharge at this time.  Patient/family educated about specific return precautions for given chief complaint and symptoms.  Patient/family educated about follow-up with PCP.     Patient/family expressed understanding of return precautions and need for follow-up. Patient spoken to regarding all imaging and laboratory results and appropriate follow up for these results. All education provided in verbal form with additional information in written form. Time was allowed for answering of patient questions. Patient discharged.    Emergency Department Medication Summary:   Medications  iohexol (OMNIPAQUE) 300 MG/ML solution 100 mL (80 mLs Intravenous Contrast Given 04/10/22 1005)           Clinical Impression:  1. Generalized abdominal pain      Discharge   Final Clinical Impression(s) / ED Diagnoses Final diagnoses:  Generalized abdominal pain    Rx / DC Orders ED Discharge Orders          Ordered    meloxicam (MOBIC) 15 MG tablet  Daily,   Status:  Discontinued        04/10/22 1149    cephALEXin (KEFLEX) 500 MG capsule  2 times daily,   Status:  Discontinued        04/10/22 1149    cephALEXin (KEFLEX) 500 MG capsule  2 times daily        04/10/22 1151    meloxicam (MOBIC) 15 MG tablet  Daily        04/10/22 1151              Glyn Ade, MD 04/10/22 1153

## 2022-06-30 ENCOUNTER — Other Ambulatory Visit: Payer: Self-pay | Admitting: Obstetrics and Gynecology

## 2022-06-30 DIAGNOSIS — Z1231 Encounter for screening mammogram for malignant neoplasm of breast: Secondary | ICD-10-CM

## 2022-08-30 ENCOUNTER — Ambulatory Visit
Admission: RE | Admit: 2022-08-30 | Discharge: 2022-08-30 | Disposition: A | Discharge status: Home / Self Care | Payer: Managed Care, Other (non HMO) | Source: Ambulatory Visit | Attending: Obstetrics and Gynecology | Admitting: Obstetrics and Gynecology

## 2022-08-30 DIAGNOSIS — Z1231 Encounter for screening mammogram for malignant neoplasm of breast: Secondary | ICD-10-CM

## 2022-09-23 ENCOUNTER — Encounter (HOSPITAL_BASED_OUTPATIENT_CLINIC_OR_DEPARTMENT_OTHER): Payer: 59 | Admitting: Nurse Practitioner

## 2022-09-28 ENCOUNTER — Encounter: Payer: Self-pay | Admitting: Nurse Practitioner

## 2022-09-28 ENCOUNTER — Ambulatory Visit: Payer: Managed Care, Other (non HMO) | Admitting: Nurse Practitioner

## 2022-09-28 VITALS — BP 128/80 | HR 72 | Ht 65.5 in | Wt 159.4 lb

## 2022-09-28 DIAGNOSIS — R41840 Attention and concentration deficit: Secondary | ICD-10-CM | POA: Diagnosis not present

## 2022-09-28 DIAGNOSIS — M65341 Trigger finger, right ring finger: Secondary | ICD-10-CM | POA: Diagnosis not present

## 2022-09-28 DIAGNOSIS — Z0000 Encounter for general adult medical examination without abnormal findings: Secondary | ICD-10-CM | POA: Diagnosis not present

## 2022-09-28 DIAGNOSIS — M79671 Pain in right foot: Secondary | ICD-10-CM

## 2022-09-28 MED ORDER — METHYLPHENIDATE HCL 5 MG PO TABS: 5.0000 mg | ORAL_TABLET | Freq: Two times a day (BID) | ORAL | 0 refills | Status: DC

## 2022-09-28 NOTE — Progress Notes (Signed)
Shawna Clamp, DNP, AGNP-c [facility] [address] [place], [place] [address] Main Office [phone]  BP 128/80   Pulse 72   Ht 5' 5.5" (1.664 m)   Wt 159 lb 6.4 oz (72.3 kg)   LMP 05/04/2015 (Approximate)   BMI 26.12 kg/m    Subjective:    Patient ID: Tanya Andrews, female    DOB: 01-30-1966, 57 y.o.   MRN: [id number]  HPI: Taleena Farling is a 57 y.o. female presenting on 09/28/2022 for comprehensive medical examination.   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 She describes feeling overwhelmed by her workload, which affects her concentration, though she does not experience these issues at home. She wonders if she might have a mild form of ADHD that primarily affects her attention, especially under stress. Eliotte has tried B12 dissolvable tablets, which have helped to some extent, but still finds it challenging to focus when preoccupied with multiple tasks.  Trigger Finger and Foot Pain Additionally, Havana reports a recent issue with her finger, known as trigger finger, which has developed over the past three months. This condition is affecting her ability to perform yoga, a key activity in her routine. She also mentions a problem with her foot on the same side, experiencing pain and a knot, which she noticed intensifying over the past six months, particularly when wearing sandals.  Gastrointestinal Health Evanne is also established with a gastroenterologist and discusses her plans for a future colonoscopy, indicating a preference for a specific location where her neighbor works. She mentions no current issues with her ears, vision, throat, or respiratory system and reports her bowel habits as unchanged, typically experiencing bowel movements every other day without discomfort.  Musculoskeletal  Concerns In terms of her musculoskeletal health, Mariette notes longstanding knee issues, which she feels might be related to arthritis, impacting her ability to squat and perform certain exercises. Despite these challenges, she remains active, incorporating gym workouts and yoga into her weekly routine to maintain flexibility and overall fitness.  Pertinent items are noted in HPI.  IMMUNIZATIONS:   Flu: Flu vaccine postponed until flu season Prevnar 13: Prevnar 13 N/A for this patient Prevnar 20: Prevnar 20 N/A for this patient Pneumovax 23: Pneumovax 23 N/A for this patient Shingrix: Due- patient to complete  HPV: HPV N/A for this patient Tetanus: Tetanus completed in the last 10 years COVID: COVID completed, documentation in chart   HEALTH MAINTENANCE: Pap Smear HM Status: is up to date Mammogram HM Status: is up to date Colon Cancer Screening HM Status: is due and to be scheduled by patient for later completion Bone Density HM Status: is not applicable for this patient STI Testing HM Status: was declined  Lung CT HM Status: is not applicable for this patient  She reports regular vision exams q1-5y: yes She reports regular dental exams q 75m:  Yes  The patient eats a regular, healthy diet. She endorses exercise and/or activity of: Active 30 + min 4+ x/wk Mode: yoga, walking, weight training.   She currently married and lives at home with her spouse. 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   Most Recent Depression Screen:     09/28/2022   11:27 AM 09/17/2021    8:06 AM  Depression screen PHQ 2/9  Decreased Interest 0 0  Down, Depressed, Hopeless 0 0  PHQ - 2 Score 0 0  Most Recent Anxiety Screen:      No data to display         Most Recent Fall Screen:    09/28/2022   11:27 AM 09/17/2021    8:06 AM  Fall Risk   Falls in the past year? 0 0  Number falls in past yr: 0 0  Injury with Fall? 0 0  Risk for fall due to : No Fall Risks No Fall Risks  Follow up  Falls evaluation completed Education provided;Falls evaluation completed    Past medical history, surgical history, medications, allergies, family history and social history reviewed with patient today and changes made to appropriate areas of the chart.  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   Krill Oil Omega-3 500 MG CAPS 2 capsules   magnesium 30 MG tablet Take 30 mg by mouth 2 (two) times daily.   metaxalone (SKELAXIN) 800 MG tablet metaxalone 800 mg tablet  Take 1 tablet 3 times a day by oral route.   naproxen (NAPROSYN) 500 MG tablet naproxen 500 mg tablet  Take 1 tablet twice a day by oral route.   No current facility-administered medications on file prior to visit.   Surgical History:  Past Surgical History:  Procedure Laterality Date   NO PAST SURGERIES     Allergies:  Allergies  Allergen Reactions   Azithromycin Nausea And Vomiting   Erythromycin Nausea Only   Prednisone Other (See Comments)   Family History:  Family History  Problem Relation Age of Onset   Angina Father 71   Heart disease Father    Heart attack Paternal Grandfather    Breast cancer Mother        105 & 58 had lumpectomy first time then mastectomy--Neg BRCA   Diabetes Mother        diet controlled   Hypertension Mother        Objective:    BP 128/80   Pulse 72   Ht 5' 5.5" (1.664 m)   Wt 159 lb 6.4 oz (72.3 kg)   LMP 05/04/2015 (Approximate)   BMI 26.12 kg/m   Wt Readings from Last 3 Encounters:  09/28/22 159 lb 6.4 oz (72.3 kg)  04/10/22 162 lb 0.6 oz (73.5 kg)  09/17/21 162 lb (73.5 kg)    Physical Exam Vitals and nursing note reviewed.  Constitutional:      General: She is not in acute  distress.    Appearance: Normal appearance.  HENT:     Head: Normocephalic and atraumatic.     Right Ear: Hearing, tympanic membrane, ear canal and external ear normal.     Left Ear: Hearing, tympanic membrane, ear canal and external ear normal.     Nose: Nose normal.     Right Sinus: No maxillary sinus tenderness or frontal sinus tenderness.     Left Sinus: No maxillary sinus tenderness or frontal sinus tenderness.     Mouth/Throat:     Lips: Pink.     Mouth: Mucous membranes are moist.     Pharynx: Oropharynx is clear.  Eyes:     General: Lids are normal. Vision grossly intact.  Extraocular Movements: Extraocular movements intact.     Conjunctiva/sclera: Conjunctivae normal.     Pupils: Pupils are equal, round, and reactive to light.     Funduscopic exam:    Right eye: Red reflex present.        Left eye: Red reflex present.    Visual Fields: Right eye visual fields normal and left eye visual fields normal.  Neck:     Thyroid: No thyromegaly.     Vascular: No carotid bruit.  Cardiovascular:     Rate and Rhythm: Normal rate and regular rhythm.     Chest Wall: PMI is not displaced.     Pulses: Normal pulses.          Dorsalis pedis pulses are 2+ on the right side and 2+ on the left side.       Posterior tibial pulses are 2+ on the right side and 2+ on the left side.     Heart sounds: Normal heart sounds. No murmur heard. Pulmonary:     Effort: Pulmonary effort is normal. No respiratory distress.     Breath sounds: Normal breath sounds.  Abdominal:     General: Abdomen is flat. Bowel sounds are normal. There is no distension.     Palpations: Abdomen is soft. There is no hepatomegaly, splenomegaly or mass.     Tenderness: There is no abdominal tenderness. There is no right CVA tenderness, left CVA tenderness, guarding or rebound.  Musculoskeletal:        General: Normal range of motion.     Cervical back: Full passive range of motion without pain, normal range of motion and  neck supple. No tenderness.     Right lower leg: No edema.     Left lower leg: No edema.  Feet:     Left foot:     Toenail Condition: Left toenails are normal.  Lymphadenopathy:     Cervical: No cervical adenopathy.     Upper Body:     Right upper body: No supraclavicular adenopathy.     Left upper body: No supraclavicular adenopathy.  Skin:    General: Skin is warm and dry.     Capillary Refill: Capillary refill takes less than 2 seconds.     Nails: There is no clubbing.  Neurological:     General: No focal deficit present.     Mental Status: She is alert and oriented to person, place, and time.     GCS: GCS eye subscore is 4. GCS verbal subscore is 5. GCS motor subscore is 6.     Sensory: Sensation is intact.     Motor: Motor function is intact.     Coordination: Coordination is intact.     Gait: Gait is intact.     Deep Tendon Reflexes: Reflexes are normal and symmetric.  Psychiatric:        Attention and Perception: Attention normal.        Mood and Affect: Mood normal.        Speech: Speech normal.        Behavior: Behavior normal. Behavior is cooperative.        Thought Content: Thought content normal.        Cognition and Memory: Cognition and memory normal.        Judgment: Judgment normal.     Results for orders placed or performed in visit on 09/28/22  Hemoglobin A1c  Result Value Ref Range   Hgb A1c MFr Bld 5.8 (H) 4.8 -  5.6 %   Est. average glucose Bld gHb Est-mCnc 120 mg/dL  CBC with Differential/Platelet  Result Value Ref Range   WBC 3.6 3.4 - 10.8 x10E3/uL   RBC 4.12 3.77 - 5.28 x10E6/uL   Hemoglobin 13.1 11.1 - 15.9 g/dL   Hematocrit 40.9 81.1 - 46.6 %   MCV 95 79 - 97 fL   MCH 31.8 26.6 - 33.0 pg   MCHC 33.5 31.5 - 35.7 g/dL   RDW 91.4 78.2 - 95.6 %   Platelets 252 150 - 450 x10E3/uL   Neutrophils 46 Not Estab. %   Lymphs 44 Not Estab. %   Monocytes 7 Not Estab. %   Eos 2 Not Estab. %   Basos 1 Not Estab. %   Neutrophils Absolute 1.7 1.4 - 7.0  x10E3/uL   Lymphocytes Absolute 1.6 0.7 - 3.1 x10E3/uL   Monocytes Absolute 0.3 0.1 - 0.9 x10E3/uL   EOS (ABSOLUTE) 0.1 0.0 - 0.4 x10E3/uL   Basophils Absolute 0.0 0.0 - 0.2 x10E3/uL   Immature Granulocytes 0 Not Estab. %   Immature Grans (Abs) 0.0 0.0 - 0.1 x10E3/uL  Comprehensive metabolic panel  Result Value Ref Range   Glucose 106 (H) 70 - 99 mg/dL   BUN 15 6 - 24 mg/dL   Creatinine, Ser 2.13 0.57 - 1.00 mg/dL   eGFR 086 >57 QI/ONG/2.95   BUN/Creatinine Ratio 25 (H) 9 - 23   Sodium 142 134 - 144 mmol/L   Potassium 4.5 3.5 - 5.2 mmol/L   Chloride 103 96 - 106 mmol/L   CO2 25 20 - 29 mmol/L   Calcium 9.9 8.7 - 10.2 mg/dL   Total Protein 7.1 6.0 - 8.5 g/dL   Albumin 4.7 3.8 - 4.9 g/dL   Globulin, Total 2.4 1.5 - 4.5 g/dL   Albumin/Globulin Ratio 2.0 1.2 - 2.2   Bilirubin Total 0.4 0.0 - 1.2 mg/dL   Alkaline Phosphatase 93 44 - 121 IU/L   AST 13 0 - 40 IU/L   ALT 19 0 - 32 IU/L  LP+Non-HDL Cholesterol  Result Value Ref Range   Cholesterol, Total 281 (H) 100 - 199 mg/dL   Triglycerides 85 0 - 149 mg/dL   HDL 284 >13 mg/dL   VLDL Cholesterol Cal 14 5 - 40 mg/dL   LDL Chol Calc (NIH) 244 (H) 0 - 99 mg/dL   Total Non-HDL-Chol (LDL+VLDL) 174 (H) 0 - 129 mg/dL         Assessment & Plan:   Problem List Items Addressed This Visit     Encounter for annual physical exam - Primary    CPE completed today.   Labs ordered. Will make changes as necessary based on results.  Review of HM activities and recommendations discussed and provided on AVS Anticipatory guidance, diet, and exercise recommendations provided.  Medications, allergies, and hx reviewed and updated as necessary.  Plan to f/u with CPE in 1 year or sooner for acute/chronic health needs as directed.        Relevant Orders   Hemoglobin A1c (Completed)   CBC with Differential/Platelet (Completed)   Comprehensive metabolic panel (Completed)   LP+Non-HDL Cholesterol (Completed)   Attention deficit    Patient  reports difficulty focusing at work, especially when under increased stress. Plan: - Trial of Ritalin 5 mg as needed on busy days - Patient to monitor for effectiveness and side effects - Follow-up in 4 weeks to evaluate response to medication      Relevant Medications   methylphenidate (  RITALIN) 5 MG tablet   Arch pain of right foot    Patient reports foot pain and a knot on the foot for the past six months, worsening with sandal use. Plan: - Referral to [facility] for evaluation and treatment      Relevant Orders   Ambulatory referral to Podiatry   Trigger ring finger of right hand    Patient reports trigger finger issue for the past three months, affecting her ability to do yoga. Plan: - Referral to [facility] for evaluation and possible treatment options, including injection or surgery if necessary      Relevant Orders   Ambulatory referral to Orthopedic Surgery       Follow up plan: Return in 3 months (on 12/29/2022) for 4-6 weeks virtual Med Check, Med Management 30.  NEXT PREVENTATIVE PHYSICAL DUE IN 1 YEAR.  PATIENT COUNSELING PROVIDED FOR ALL ADULT PATIENTS: A well balanced diet low in saturated fats, cholesterol, and moderation in carbohydrates.  This can be as simple as monitoring portion sizes and cutting back on sugary beverages such as soda and juice to start with.    Daily water consumption of at least 64 ounces.  Physical activity at least 180 minutes per week.  If just starting out, start 10 minutes a day and work your way up.   This can be as simple as taking the stairs instead of the elevator and walking 2-3 laps around the office  purposefully every day.   STD protection, partner selection, and regular testing if high risk.  Limited consumption of alcoholic beverages if alcohol is consumed. For men, I recommend no more than 14 alcoholic beverages per week, spread out throughout the week (max 2 per day). Avoid  "binge" drinking or consuming large quantities of alcohol in one setting.  Please let me know if you feel you may need help with reduction or quitting alcohol consumption.   Avoidance of nicotine, if used. Please let me know if you feel you may need help with reduction or quitting nicotine use.   Daily mental health attention. This can be in the form of 5 minute daily meditation, prayer, journaling, yoga, reflection, etc.  Purposeful attention to your emotions and mental state can significantly improve your overall wellbeing  and  Health.  Please know that I am here to help you with all of your health care goals and am happy to work with you to find a solution that works best for you.  The greatest advice I have received with any changes in life are to take it one step at a time, that even means if all you can focus on is the next 60 seconds, then do that and celebrate your victories.  With any changes in life, you will have set backs, and that is OK. The important thing to remember is, if you have a set back, it is not a failure, it is an opportunity to try again! Screening Testing Mammogram Every 1 -2 years based on history and risk factors Starting at age 23 Pap Smear Ages 21-39 every 3 years Ages 57-65 every 5 years with HPV testing More frequent testing may be required based on results and history Colon Cancer Screening Every 1-10 years based on test performed, risk factors, and history Starting at age 63 Bone Density Screening Every 2-10 years based on history Starting at age 36 for women Recommendations for men differ based on medication usage, history, and risk factors AAA  Screening One time ultrasound Men 57-9 years old who have every smoked Lung Cancer Screening Low Dose Lung CT every 12 months Age 24-80 years with a 30 pack-year smoking history who still smoke or who have quit within the last 15 years   Screening Labs Routine  Labs: Complete Blood Count (CBC),  Complete Metabolic Panel (CMP), Cholesterol (Lipid Panel) Every 6-12 months based on history and medications May be recommended more frequently based on current conditions or previous results Hemoglobin A1c Lab Every 3-12 months based on history and previous results Starting at age 70 or earlier with diagnosis of diabetes, high cholesterol, BMI >26, and/or risk factors Frequent monitoring for patients with diabetes to ensure blood sugar control Thyroid Panel (TSH) Every 6 months based on history, symptoms, and risk factors May be repeated more often if on medication HIV One time testing for all patients 38 and older May be repeated more frequently for patients with increased risk factors or exposure Hepatitis C One time testing for all patients 18 and older May be repeated more frequently for patients with increased risk factors or exposure Gonorrhea, Chlamydia Every 12 months for all sexually active persons 13-24 years Additional monitoring may be recommended for those who are considered high risk or who have symptoms Every 12 months for any woman on birth control, regardless of sexual activity PSA Men 64-48 years old with risk factors Additional screening may be recommended from age 24-69 based on risk factors, symptoms, and history  Vaccine Recommendations Tetanus Booster All adults every 10 years Flu Vaccine All patients 6 months and older every year COVID Vaccine All patients 12 years and older Initial dosing with booster May recommend additional booster based on age and health history HPV Vaccine 2 doses all patients age 40-26 Dosing may be considered for patients over 26 Shingles Vaccine (Shingrix) 2 doses all adults 55 years and older Pneumonia (Pneumovax 23) All adults 65 years and older May recommend earlier dosing based on health history One year apart from Prevnar 13 Pneumonia (Prevnar 78) All adults 65 years and older Dosed 1 year after Pneumovax 23 Pneumonia  (Prevnar 20) One time alternative to the two dosing of 13 and 23 For all adults with initial dose of 23, 20 is recommended 1 year later For all adults with initial dose of 13, 23 is still recommended as second option 1 year later

## 2022-09-28 NOTE — Patient Instructions (Addendum)
I have sent in the Ritalin 5mg  for you to try to see if this helps with your attention and focus. We can check and see how you are doing in about 4 weeks with a virtual visit to make sure we don't need to make any changes.   Let me know about the colonoscopy and I will be happy to send the referral if needed.   I sent the referral to podiatry and to orthopedics for your foot and hand.   For all adult patients, I recommend A well balanced diet low in saturated fats, cholesterol, and moderation in carbohydrates.   This can be as simple as monitoring portion sizes and cutting back on sugary beverages such as soda and juice to start with.    Daily water consumption of at least 64 ounces.  Physical activity at least 180 minutes per week, if just starting out.   This can be as simple as taking the stairs instead of the elevator and walking 2-3 laps around the office  purposefully every day.   STD protection, partner selection, and regular testing if high risk.  Limited consumption of alcoholic beverages if alcohol is consumed.  For women, I recommend no more than 7 alcoholic beverages per week, spread out throughout the week.  Avoid "binge" drinking or consuming large quantities of alcohol in one setting.   Please let me know if you feel you may need help with reduction or quitting alcohol consumption.   Avoidance of nicotine, if used.  Please let me know if you feel you may need help with reduction or quitting nicotine use.   Daily mental health attention.  This can be in the form of 5 minute daily meditation, prayer, journaling, yoga, reflection, etc.   Purposeful attention to your emotions and mental state can significantly improve your overall wellbeing  and  Health.  Please know that I am here to help you with all of your health care goals and am happy to work with you to find a solution that works best for you.  The greatest advice I have received with any changes in life are to take it  one step at a time, that even means if all you can focus on is the next 60 seconds, then do that and celebrate your victories.  With any changes in life, you will have set backs, and that is OK. The important thing to remember is, if you have a set back, it is not a failure, it is an opportunity to try again!  Health Maintenance Recommendations Screening Testing Mammogram Every 1 -2 years based on history and risk factors Starting at age 87 Pap Smear Ages 21-39 every 3 years Ages 80-65 every 5 years with HPV testing More frequent testing may be required based on results and history Colon Cancer Screening Every 1-10 years based on test performed, risk factors, and history Starting at age 42 Bone Density Screening Every 2-10 years based on history Starting at age 79 for women Recommendations for men differ based on medication usage, history, and risk factors AAA Screening One time ultrasound Men 63-99 years old who have every smoked Lung Cancer Screening Low Dose Lung CT every 12 months Age 57-80 years with a 30 pack-year smoking history who still smoke or who have quit within the last 15 years  Screening Labs Routine  Labs: Complete Blood Count (CBC), Complete Metabolic Panel (CMP), Cholesterol (Lipid Panel) Every 6-12 months based on history and medications May be recommended more frequently  based on current conditions or previous results Hemoglobin A1c Lab Every 3-12 months based on history and previous results Starting at age 663 or earlier with diagnosis of diabetes, high cholesterol, BMI >26, and/or risk factors Frequent monitoring for patients with diabetes to ensure blood sugar control Thyroid Panel (TSH w/ T3 & T4) Every 6 months based on history, symptoms, and risk factors May be repeated more often if on medication HIV One time testing for all patients 72 and older May be repeated more frequently for patients with increased risk factors or exposure Hepatitis C One  time testing for all patients 57 and older May be repeated more frequently for patients with increased risk factors or exposure Gonorrhea, Chlamydia Every 12 months for all sexually active persons 13-24 years Additional monitoring may be recommended for those who are considered high risk or who have symptoms PSA Men 36-4 years old with risk factors Additional screening may be recommended from age 54-69 based on risk factors, symptoms, and history  Vaccine Recommendations Tetanus Booster All adults every 10 years Flu Vaccine All patients 6 months and older every year COVID Vaccine All patients 12 years and older Initial dosing with booster May recommend additional booster based on age and health history HPV Vaccine 2 doses all patients age 66-26 Dosing may be considered for patients over 26 Shingles Vaccine (Shingrix) 2 doses all adults 55 years and older Pneumonia (Pneumovax 23) All adults 65 years and older May recommend earlier dosing based on health history Pneumonia (Prevnar 83) All adults 65 years and older Dosed 1 year after Pneumovax 23  Additional Screening, Testing, and Vaccinations may be recommended on an individualized basis based on family history, health history, risk factors, and/or exposure.

## 2022-09-29 ENCOUNTER — Encounter: Payer: Self-pay | Admitting: Nurse Practitioner

## 2022-09-29 DIAGNOSIS — R41840 Attention and concentration deficit: Secondary | ICD-10-CM | POA: Insufficient documentation

## 2022-09-29 DIAGNOSIS — M65341 Trigger finger, right ring finger: Secondary | ICD-10-CM | POA: Insufficient documentation

## 2022-09-29 DIAGNOSIS — M79671 Pain in right foot: Secondary | ICD-10-CM | POA: Insufficient documentation

## 2022-09-29 LAB — CBC WITH DIFFERENTIAL/PLATELET
Basophils Absolute: 0 10*3/uL (ref 0.0–0.2)
Basos: 1 %
EOS (ABSOLUTE): 0.1 10*3/uL (ref 0.0–0.4)
Eos: 2 %
Hematocrit: 39.1 % (ref 34.0–46.6)
Hemoglobin: 13.1 g/dL (ref 11.1–15.9)
Immature Grans (Abs): 0 10*3/uL (ref 0.0–0.1)
Immature Granulocytes: 0 %
Lymphocytes Absolute: 1.6 10*3/uL (ref 0.7–3.1)
Lymphs: 44 %
MCH: 31.8 pg (ref 26.6–33.0)
MCHC: 33.5 g/dL (ref 31.5–35.7)
MCV: 95 fL (ref 79–97)
Monocytes Absolute: 0.3 10*3/uL (ref 0.1–0.9)
Monocytes: 7 %
Neutrophils Absolute: 1.7 10*3/uL (ref 1.4–7.0)
Neutrophils: 46 %
Platelets: 252 10*3/uL (ref 150–450)
RBC: 4.12 x10E6/uL (ref 3.77–5.28)
RDW: 11.7 % (ref 11.7–15.4)
WBC: 3.6 10*3/uL (ref 3.4–10.8)

## 2022-09-29 LAB — LP+NON-HDL CHOLESTEROL
Cholesterol, Total: 281 mg/dL — ABNORMAL HIGH (ref 100–199)
HDL: 107 mg/dL (ref >39)
LDL Chol Calc (NIH): 160 mg/dL — ABNORMAL HIGH (ref 0–99)
Total Non-HDL-Chol (LDL+VLDL): 174 mg/dL — ABNORMAL HIGH (ref 0–129)
Triglycerides: 85 mg/dL (ref 0–149)
VLDL Cholesterol Cal: 14 mg/dL (ref 5–40)

## 2022-09-29 LAB — COMPREHENSIVE METABOLIC PANEL
ALT: 19 IU/L (ref 0–32)
AST: 13 IU/L (ref 0–40)
Albumin/Globulin Ratio: 2 (ref 1.2–2.2)
Albumin: 4.7 g/dL (ref 3.8–4.9)
Alkaline Phosphatase: 93 IU/L (ref 44–121)
BUN/Creatinine Ratio: 25 — ABNORMAL HIGH (ref 9–23)
BUN: 15 mg/dL (ref 6–24)
Bilirubin Total: 0.4 mg/dL (ref 0.0–1.2)
CO2: 25 mmol/L (ref 20–29)
Calcium: 9.9 mg/dL (ref 8.7–10.2)
Chloride: 103 mmol/L (ref 96–106)
Creatinine, Ser: 0.61 mg/dL (ref 0.57–1.00)
Globulin, Total: 2.4 g/dL (ref 1.5–4.5)
Glucose: 106 mg/dL — ABNORMAL HIGH (ref 70–99)
Potassium: 4.5 mmol/L (ref 3.5–5.2)
Sodium: 142 mmol/L (ref 134–144)
Total Protein: 7.1 g/dL (ref 6.0–8.5)
eGFR: 103 mL/min/{1.73_m2} (ref >59)

## 2022-09-29 LAB — HEMOGLOBIN A1C
Est. average glucose Bld gHb Est-mCnc: 120 mg/dL
Hgb A1c MFr Bld: 5.8 % — ABNORMAL HIGH (ref 4.8–5.6)

## 2022-09-29 NOTE — Assessment & Plan Note (Signed)
Patient reports trigger finger issue for the past three months, affecting her ability to do yoga. Plan: - [facility] for evaluation and possible treatment options, including injection or surgery if necessary

## 2022-09-29 NOTE — Assessment & Plan Note (Signed)
CPE completed today.   Labs ordered. Will make changes as necessary based on results.  Review of HM activities and recommendations discussed and provided on AVS Anticipatory guidance, diet, and exercise recommendations provided.  Medications, allergies, and hx reviewed and updated as necessary.  Plan to f/u with CPE in 1 year or sooner for acute/chronic health needs as directed.   

## 2022-09-29 NOTE — Assessment & Plan Note (Signed)
Patient reports foot pain and a knot on the foot for the past six months, worsening with sandal use. Plan: - Referral to [facility] for evaluation and treatment

## 2022-09-29 NOTE — Assessment & Plan Note (Signed)
Patient reports difficulty focusing at work, especially when under increased stress. Plan: - Trial of Ritalin 5 mg as needed on busy days - Patient to monitor for effectiveness and side effects - Follow-up in 4 weeks to evaluate response to medication

## 2022-10-12 ENCOUNTER — Ambulatory Visit (INDEPENDENT_AMBULATORY_CARE_PROVIDER_SITE_OTHER): Payer: Managed Care, Other (non HMO)

## 2022-10-12 ENCOUNTER — Ambulatory Visit (INDEPENDENT_AMBULATORY_CARE_PROVIDER_SITE_OTHER): Payer: Managed Care, Other (non HMO) | Admitting: Podiatry

## 2022-10-12 ENCOUNTER — Other Ambulatory Visit: Payer: Self-pay | Admitting: Podiatry

## 2022-10-12 DIAGNOSIS — M2041 Other hammer toe(s) (acquired), right foot: Secondary | ICD-10-CM | POA: Diagnosis not present

## 2022-10-12 DIAGNOSIS — M722 Plantar fascial fibromatosis: Secondary | ICD-10-CM

## 2022-10-12 DIAGNOSIS — M2042 Other hammer toe(s) (acquired), left foot: Secondary | ICD-10-CM

## 2022-10-13 NOTE — Progress Notes (Signed)
Subjective:  Patient ID: Tanya Andrews, female    DOB: [birth year],  MRN: [id number] HPI Chief Complaint  Patient presents with   Foot Pain    Right foot pain, plantar area has a knot that she noticed. It doesn't really hurt but over time there is some discomfort. Aggravating factor when she wakes up in the morning its a little tighter and stiffer than normal. No Treatment     57 y.o. female presents with the above complaint.   ROS: Denies fever chills nausea vomit muscle aches pains calf pain back pain chest pain shortness of breath.  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   Toe fracture    late 20s   Past Surgical History:  Procedure Laterality Date   NO PAST SURGERIES      Current Outpatient Medications:    estradiol (ESTRACE) 0.1 MG/GM vaginal cream, Place 1/2 gram per vagina at bedtime 2- 3 times per week., Disp: 42.5 g, Rfl: 2   Krill Oil Omega-3 500 MG CAPS, 2 capsules, Disp: , Rfl:    magnesium 30 MG tablet, Take 30 mg by mouth 2 (two) times daily., Disp: , Rfl:    metaxalone (SKELAXIN) 800 MG tablet, metaxalone 800 mg tablet  Take 1 tablet 3 times a day by oral route., Disp: , Rfl:    methylphenidate (RITALIN) 5 MG tablet, Take 1 tablet (5 mg total) by mouth 2 (two) times daily., Disp: 60 tablet, Rfl: 0   naproxen (NAPROSYN) 500 MG tablet, naproxen 500 mg tablet  Take 1 tablet twice a day by oral route., Disp: , Rfl:   Allergies  Allergen Reactions   Azithromycin Nausea And Vomiting   Erythromycin Nausea Only   Prednisone Other (See Comments)   Review of Systems Objective:  There were no vitals filed for this visit.  General: Well developed, nourished, in no acute distress, alert and oriented x3   Dermatological: Skin is warm, dry and supple bilateral. Nails x 10 are well maintained; remaining integument appears unremarkable at this time. There are no open sores, no preulcerative lesions, no rash or signs  of infection present.  Vascular: Dorsalis Pedis artery and Posterior Tibial artery pedal pulses are 2/4 bilateral with immedate capillary fill time. Pedal hair growth present. No varicosities and no lower extremity edema present bilateral.   Neruologic: Grossly intact via light touch bilateral. Vibratory intact via tuning fork bilateral. Protective threshold with Semmes Wienstein monofilament intact to all pedal sites bilateral. Patellar and Achilles deep tendon reflexes 2+ bilateral. No Babinski or clonus noted bilateral.   Musculoskeletal: No gross boney pedal deformities bilateral. No pain, crepitus, or limitation noted with foot and ankle range of motion bilateral. Muscular strength 5/5 in all groups tested bilateral.  She has a nonpulsatile mass to the plantar aspect of the medial distal plantar fascia just proximal to the sesamoids.  She also has stiffness and range of motion of the first metatarsophalangeal joint right foot.  Noted Dupuytren's contracture to the right hand.  Gait: Unassisted, Nonantalgic.    Radiographs:  Radiographs taken today demonstrate osseously mature individual joint space narrowing of the first metatarsophalangeal joint with some early spurring.  Early osteoarthritis hallux limitus.  Assessment & Plan:   Assessment: Plantar fibroma nontender noncomplicated.  Osteoarthritis first metatarsophalangeal joint right foot.  Plan: Discussed different therapies such as injection therapy for the fibroma should it become more painful as well as for the joint.  Discussed possible need for surgical intervention regarding the joint at some point.     Dariel Betzer T. [place], [place]

## 2022-10-13 NOTE — Progress Notes (Deleted)
57 y.o. G71P0000 Married Caucasian female here for annual exam.    PCP:     Patient's last menstrual period was 05/04/2015 (approximate).           Sexually active: {yes no:314532}  The current method of family planning is post menopausal status.    Exercising: {yes no:314532}  {types:19826} Smoker:  no  Health Maintenance: Pap:  07/28/20 neg: HR HPV, 03/2019 normal per pt History of abnormal Pap:  no MMG:  08/30/22 Breast Density Cat C, BI-RADS CAT 1 neg Colonoscopy:  2019 polyp BMD:   n/a  Result  n/a TDaP:  PCP Gardasil:   no HIV: no Hep C: no Screening Labs:  Hb today: ***, Urine today: ***   reports that she has never smoked. She has never used smokeless tobacco. She reports current alcohol use of about 5.0 standard drinks of alcohol per week. She reports that she does not use drugs.  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 42.5 g 2   Krill Oil Omega-3 500 MG CAPS 2 capsules     magnesium 30 MG tablet Take 30 mg by mouth 2 (two) times daily.     metaxalone (SKELAXIN) 800 MG tablet metaxalone 800 mg tablet  Take 1 tablet 3 times a day by oral route.     methylphenidate (RITALIN) 5 MG tablet Take 1 tablet (5 mg total) by mouth 2 (two) times daily. 60 tablet 0   naproxen (NAPROSYN) 500 MG tablet naproxen 500 mg tablet  Take 1 tablet twice a day by oral route.     No current facility-administered medications for this visit.    Family History  Problem Relation Age of Onset   Angina Father 12   Heart disease Father    Heart attack Paternal Grandfather    Breast cancer Mother        63 & 90 had lumpectomy first time then mastectomy--Neg BRCA    Diabetes Mother        diet controlled   Hypertension Mother     Review of Systems  Exam:   LMP 05/04/2015 (Approximate)     General appearance: alert, cooperative and appears stated age Head: normocephalic, without obvious abnormality, atraumatic Neck: no adenopathy, supple, symmetrical, trachea midline and thyroid normal to inspection and palpation Lungs: clear to auscultation bilaterally Breasts: normal appearance, no masses or tenderness, No nipple retraction or dimpling, No nipple discharge or bleeding, No axillary adenopathy Heart: regular rate and rhythm Abdomen: soft, non-tender; no masses, no organomegaly Extremities: extremities normal, atraumatic, no cyanosis or edema Skin: skin color, texture, turgor normal. No rashes or lesions Lymph nodes: cervical, supraclavicular, and axillary nodes normal. Neurologic: grossly normal  Pelvic: External genitalia:  no lesions              No abnormal inguinal nodes palpated.              Urethra:  normal appearing urethra with no masses, tenderness or lesions              Bartholins and Skenes: normal  Vagina: normal appearing vagina with normal color and discharge, no lesions              Cervix: no lesions              Pap taken: {yes no:314532} Bimanual Exam:  Uterus:  normal size, contour, position, consistency, mobility, non-tender              Adnexa: no mass, fullness, tenderness              Rectal exam: {yes no:314532}.  Confirms.              Anus:  normal sphincter tone, no lesions  Chaperone was present for exam:  ***  Assessment:   Well woman visit with gynecologic exam.   Plan: Mammogram screening discussed. Self breast awareness reviewed. Pap and HR HPV as above. Guidelines for Calcium, Vitamin D, regular exercise program including cardiovascular and weight bearing exercise.   Follow up annually and prn.   Additional counseling given.  {yes [id number]. _______ minutes face to face time of  which over 50% was spent in counseling.    After visit summary provided.

## 2022-10-20 ENCOUNTER — Ambulatory Visit: Payer: 59 | Admitting: Obstetrics and Gynecology

## 2022-10-27 ENCOUNTER — Ambulatory Visit: Payer: 59 | Admitting: Obstetrics and Gynecology

## 2022-11-08 ENCOUNTER — Encounter: Payer: Self-pay | Admitting: Nurse Practitioner

## 2022-11-08 ENCOUNTER — Telehealth (INDEPENDENT_AMBULATORY_CARE_PROVIDER_SITE_OTHER): Payer: Managed Care, Other (non HMO) | Admitting: Nurse Practitioner

## 2022-11-08 VITALS — Wt 154.0 lb

## 2022-11-08 DIAGNOSIS — R7303 Prediabetes: Secondary | ICD-10-CM | POA: Insufficient documentation

## 2022-11-08 DIAGNOSIS — M65341 Trigger finger, right ring finger: Secondary | ICD-10-CM

## 2022-11-08 DIAGNOSIS — R41840 Attention and concentration deficit: Secondary | ICD-10-CM | POA: Diagnosis not present

## 2022-11-08 DIAGNOSIS — E782 Mixed hyperlipidemia: Secondary | ICD-10-CM

## 2022-11-08 MED ORDER — METHYLPHENIDATE HCL 10 MG PO TABS: 10.0000 mg | ORAL_TABLET | Freq: Every day | ORAL | 0 refills | Status: DC

## 2022-11-08 MED ORDER — METHYLPHENIDATE HCL 5 MG PO TABS: 5.0000 mg | ORAL_TABLET | Freq: Every day | ORAL | 0 refills | Status: DC

## 2022-11-08 NOTE — Progress Notes (Signed)
Virtual Visit Encounter mychart visit.   I connected with  Tanya Andrews on 11/08/22 at 11:45 AM EDT by secure video and audio telemedicine application. I verified that I am speaking with the correct person using two identifiers.   I introduced myself as a Publishing rights manager with the practice. The limitations of evaluation and management by telemedicine discussed with the patient and the availability of in person appointments. The patient expressed verbal understanding and consent to proceed.  Participating parties in this visit include: Myself and patient  The patient is: Patient Location: Home I am: Provider Location: Office/Clinic Subjective:    CC and HPI: Tanya Andrews is a 57 y.o. year old female presenting for follow up of ADHD. Patient reports the following:  ADHD Tanya Andrews was recently started on Ritalin 5 mg up to twice a day for positive ADHD diagnosis.  She has not previously been on stimulant medication for management of this.  She tells me that since starting Ritalin she has noted an improvement in her ability to focus and complete her work while taking the medication.  She does admit that it is not a drastic difference but definitely notable.  She reports that she has been using the medication extremely conservatively and is only taken about 10 doses since it was prescribed.  She reports the medication wearing off around 3 PM however, she has avoided taking the second dose due to concerns with insomnia.  She is interested in increasing the dose in the morning to see if she can achieve further improvement of her symptoms.  Labs Tanya Andrews would like to discuss her most recent labs today.  She reports a strong family history of hyperlipidemia in her mother, father, and sister.  She also reports diabetes in both her mother and father.  Tanya Andrews reports eating an extremely healthy diet with no sugars, 3 vegetables at every meal, and salmon 3-4 times a week.  She is also  taking about 20 g of fiber through her diet.  In the past she has been more physically active with exercise to the point of perspiration.  She has not been consistent with this in the recent months though.   She expresses concerns over next steps for both her cholesterol and her blood sugar readings.     Past medical history, Surgical history, Family history not pertinant except as noted below, Social history, Allergies, and medications have been entered into the medical record, reviewed, and corrections made.   Review of Systems:  All review of systems negative except what is listed in the HPI  Objective:    Alert and oriented x 4 Speaking in clear sentences with no shortness of breath. No distress.  Impression and Recommendations:    Problem List Items Addressed This Visit     Hyperlipidemia    Discussion of diagnosis of hyperlipidemia and specific findings on labs.  We discussed the option of starting medication or management versus attempting to increase physical activity and fiber intake to see if this is helpful for management without medication.  She does have an extremely healthy diet and clean lifestyle which does indicate possible genetic predisposition. Plan: - Increase fiber to 30 g a day and work to obtain 150 minutes of moderate intensity physical activity weekly.  We will plan to recheck lipids and A1c in about 6 months to see where this stands.      Relevant Orders   LP+Non-HDL Cholesterol   Comprehensive metabolic panel   Attention deficit - Primary  Tanya Andrews is seen marked improvement with ADHD symptoms since starting on Ritalin.   She does feel that there is room for potential improvement.  We discussed the option of slightly increasing the dose in the morning and she is interested in this.  We also discussed the option of taking the afternoon dose around noon prior to noting when the medication begins to wear off. Plan: - Ritalin 10mg  in the morning and 5 mg as  needed in the afternoon sent for patient. -Follow-up in 3 to 6 months.      Relevant Medications   methylphenidate (RITALIN) 10 MG tablet (Start on 12/06/2022)   methylphenidate (RITALIN) 5 MG tablet (Start on 12/06/2022)   methylphenidate (RITALIN) 10 MG tablet (Start on 01/03/2023)   methylphenidate (RITALIN) 5 MG tablet (Start on 01/03/2023)   methylphenidate (RITALIN) 10 MG tablet   Trigger ring finger of right hand    Previous diagnosis of trigger finger, however, podiatry suggested that this could be related in some manner to arch pain she has been experiencing and suggested a hand specialist. Referral placed for patient.       Relevant Orders   Ambulatory referral to Hand Surgery   Pre-diabetes    Recent labs show diagnosis of prediabetes with A1c at 5.8%.  There is a strong family history of diabetes.  Today we discussed the significant impact that genetics have on development of this.  We also discussed the impact that lifestyle measures may also have.  She is following a very healthy diet and fairly active lifestyle at this time.  We discussed changes that could be effective and helping with ongoing management. Plan: - Increase fiber in diet to 30 g daily. - Increase aerobic physical activity to a goal of 150 minutes/week. - Recheck labs in approximately 6 months.      Relevant Orders   Hemoglobin A1c   Comprehensive metabolic panel    orders and follow up as documented in EMR I discussed the assessment and treatment plan with the patient. The patient was provided an opportunity to ask questions and all were answered. The patient agreed with the plan and demonstrated an understanding of the instructions.   The patient was advised to call back or seek an in-person evaluation if the symptoms worsen or if the condition fails to improve as anticipated.  Follow-Up: in 6 months  I provided 35 minutes of non-face-to-face interaction with this non face-to-face encounter including  intake, same-day documentation, and chart review.   Tollie Eth, NP , DNP, AGNP-c [facility]

## 2022-11-08 NOTE — Assessment & Plan Note (Signed)
Tanya Andrews is seen marked improvement with ADHD symptoms since starting on Ritalin.   She does feel that there is room for potential improvement.  We discussed the option of slightly increasing the dose in the morning and she is interested in this.  We also discussed the option of taking the afternoon dose around noon prior to noting when the medication begins to wear off. Plan: - Ritalin 10mg  in the morning and 5 mg as needed in the afternoon sent for patient. -Follow-up in 3 to 6 months.

## 2022-11-08 NOTE — Assessment & Plan Note (Signed)
Recent labs show diagnosis of prediabetes with A1c at 5.8%.  There is a strong family history of diabetes.  Today we discussed the significant impact that genetics have on development of this.  We also discussed the impact that lifestyle measures may also have.  She is following a very healthy diet and fairly active lifestyle at this time.  We discussed changes that could be effective and helping with ongoing management. Plan: - Increase fiber in diet to 30 g daily. - Increase aerobic physical activity to a goal of 150 minutes/week. - Recheck labs in approximately 6 months.

## 2022-11-08 NOTE — Assessment & Plan Note (Signed)
Discussion of diagnosis of hyperlipidemia and specific findings on labs.  We discussed the option of starting medication or management versus attempting to increase physical activity and fiber intake to see if this is helpful for management without medication.  She does have an extremely healthy diet and clean lifestyle which does indicate possible genetic predisposition. Plan: - Increase fiber to 30 g a day and work to obtain 150 minutes of moderate intensity physical activity weekly.  We will plan to recheck lipids and A1c in about 6 months to see where this stands.

## 2022-11-08 NOTE — Assessment & Plan Note (Signed)
Previous diagnosis of trigger finger, however, podiatry suggested that this could be related in some manner to arch pain she has been experiencing and suggested a hand specialist. Referral placed for patient.

## 2022-11-26 ENCOUNTER — Telehealth (INDEPENDENT_AMBULATORY_CARE_PROVIDER_SITE_OTHER): Payer: Managed Care, Other (non HMO) | Admitting: Nurse Practitioner

## 2022-11-26 ENCOUNTER — Encounter: Payer: Self-pay | Admitting: Nurse Practitioner

## 2022-11-26 VITALS — Wt 154.0 lb

## 2022-11-26 DIAGNOSIS — L03031 Cellulitis of right toe: Secondary | ICD-10-CM | POA: Diagnosis not present

## 2022-11-26 DIAGNOSIS — L03039 Cellulitis of unspecified toe: Secondary | ICD-10-CM

## 2022-11-26 MED ORDER — MOMETASONE FUROATE 0.1 % EX CREA: TOPICAL_CREAM | CUTANEOUS | 1 refills | Status: DC

## 2022-11-26 NOTE — Progress Notes (Signed)
Virtual Visit Encounter mychart visit.   I connected with  Toya Smothers on 11/29/22 at  2:30 PM EDT by secure video and audio telemedicine application. I verified that I am speaking with the correct person using two identifiers.   I introduced myself as a Publishing rights manager with the practice. The limitations of evaluation and management by telemedicine discussed with the patient and the availability of in person appointments. The patient expressed verbal understanding and consent to proceed.  Participating parties in this visit include: Myself and patient  The patient is: Patient Location: Home I am: Provider Location: Office/Clinic Subjective:    CC and HPI: Prudence Fogelson is a 57 y.o. year old female presenting for new evaluation and treatment of toenail abnormality. Patient reports the following:  Yiselle reports noticing abnormality of her toenails after visiting a different salon to have a pedicure. She reports lifting appearance a several toenails with her great toe being the worst. She has also noticed unusual debris under the toenail, sensitivity to the toe when wearing closed toed shoes, and discoloration of the nails. She has never had this happen before. She has also had pain to the toe joint, arthritic in nature, over the past few months and questions whether this could be related.   Past medical history, Surgical history, Family history not pertinant except as noted below, Social history, Allergies, and medications have been entered into the medical record, reviewed, and corrections made.   Review of Systems:  All review of systems negative except what is listed in the HPI  Objective:    Alert and oriented x 4 Speaking in clear sentences with no shortness of breath. No distress. Images of toenails sent via mychart and reviewed.  Impression and Recommendations:    Problem List Items Addressed This Visit     Inflammation of toenail - Primary     Discoloration, lifting, and debris under the toenail noted on the right great toe. 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 We discussed the etiology and recommendations for this. At this time I am unable to completely rule out fungal component, but will begin treatment for psoriatic response today and monitor for improvement. She denies any history of psoriasis, but this could be the first incident. Also consider the pain in the toe joint to be related to psoriatic arthritis. No evidence of other joint involvement at this time.  Plan: - Recommend topical steroid and A&D ointment applied to the toenail and surrounding skin twice daily. If no improvement in the next 3-4 weeks, we can consider starting antifungal treatment.  - Consider the arthritic pain in the toenail to be a component of psoriatic arthritis, as this can occur.       Relevant Medications   mometasone (ELOCON) 0.1 % cream    orders and follow up as documented in EMR I discussed the assessment and treatment plan with the patient. The patient was provided an opportunity to ask questions and all were answered. The patient agreed with the plan and demonstrated an understanding of the instructions.   The patient was advised to call back or seek an in-person evaluation if the symptoms worsen or if the condition fails to improve as anticipated.  Follow-Up: prn  I provided 18 minutes of non-face-to-face interaction with this non face-to-face encounter including intake, same-day documentation, and chart review.   Tollie Eth, NP ,  DNP, AGNP-c [facility]

## 2022-11-29 DIAGNOSIS — L03039 Cellulitis of unspecified toe: Secondary | ICD-10-CM | POA: Insufficient documentation

## 2022-11-29 NOTE — Assessment & Plan Note (Signed)
Discoloration, lifting, and debris under the toenail noted on the right great toe. 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 We discussed the etiology and recommendations for this. At this time I am unable to completely rule out fungal component, but will begin treatment for psoriatic response today and monitor for improvement. She denies any history of psoriasis, but this could be the first incident. Also consider the pain in the toe joint to be related to psoriatic arthritis. No evidence of other joint involvement at this time.  Plan: - Recommend topical steroid and A&D ointment applied to the toenail and surrounding skin twice daily. If no improvement in the next 3-4 weeks, we can consider starting antifungal treatment.  - Consider the arthritic pain in the toenail to be a component of psoriatic arthritis, as this can occur.

## 2022-11-30 NOTE — Progress Notes (Unsigned)
57 y.o. G71P0000 Married Caucasian female here for annual exam.    Using vaginal estrogen 1 - 2 times per week.  Using lubricant.  Sex is not painful.  Some hot flashes, improved with dietary control.  Working on lowering her cholesterol.  PCP: Enid Skeens, NP  Patient's last menstrual period was 05/04/2015 (approximate).           Sexually active: Yes.    The current method of family planning is post menopausal status.    Exercising: Yes.     Yoga once weekly, strength training twice weekly, cardio in btwn Smoker: no  Health Maintenance: Pap: 07/28/2020-WNL, HRHPV- neg, 03/2019-WNL per pt History of abnormal Pap: no MMG: 08/30/2022- neg birads 1, Cat C Colonoscopy: 2019, polyp; Due 2024; plans to do this fall.  BMD: n/a TDaP: PCP, 08/15/2014 Gardasil: no HIV: no Hep C: no Screening Labs:  PCP   reports that she has never smoked. She has never used smokeless tobacco. She reports current alcohol use of about 3.0 - 4.0 standard drinks of alcohol per week. She reports that she does not use drugs.  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 42.5 g 2   Krill Oil Omega-3 500 MG CAPS      magnesium 30 MG tablet Take 30 mg by mouth 2 (two) times daily.     methylphenidate (RITALIN) 10 MG tablet Take 1 tablet (10 mg total) by mouth daily at 6 (six) AM. Script 1 of 3 30 tablet 0   mometasone (ELOCON) 0.1 % cream Apply to the toenail twice a day as directed with vitamin A&D ointment. 45 g 1   No current facility-administered medications for this visit.    Family History  Problem Relation Age of Onset   Angina Father 30   Heart disease Father    Heart attack  Paternal Grandfather    Breast cancer Mother        20 & 28 had lumpectomy first time then mastectomy--Neg BRCA   Diabetes Mother        diet controlled   Hypertension Mother     Review of Systems  All other systems reviewed and are negative.   Exam:   BP 112/72   Pulse 70   Ht 5' 5.25" (1.657 m)   Wt 157 lb (71.2 kg)   LMP 05/04/2015 (Approximate)   SpO2 97%   BMI 25.93 kg/m     General appearance: alert, cooperative and appears stated age Head: normocephalic, without obvious abnormality, atraumatic Neck: no adenopathy, supple, symmetrical, trachea midline and thyroid normal to inspection and palpation Lungs: clear to auscultation bilaterally Breasts: normal appearance, no masses or tenderness, No nipple retraction or dimpling, No nipple discharge or bleeding, No axillary adenopathy Heart: regular rate and rhythm Abdomen: soft, non-tender; no masses, no organomegaly Extremities: extremities normal, atraumatic, no cyanosis or edema Skin: skin color, texture, turgor normal. No rashes or lesions Lymph nodes: cervical, supraclavicular, and axillary nodes normal. Neurologic: grossly normal  Pelvic: External genitalia:  no lesions              No abnormal  inguinal nodes palpated.              Urethra:  normal appearing urethra with no masses, tenderness or lesions              Bartholins and Skenes: normal                 Vagina: normal appearing vagina with normal color and discharge, no lesions              Cervix: no lesions              Pap taken: no Bimanual Exam:  Uterus:  normal size, contour, position, consistency, mobility, non-tender              Adnexa: no mass, fullness, tenderness              Rectal exam: yes.  Confirms.              Anus:  normal sphincter tone, no lesions  Chaperone was present for exam:  Ladona Ridgel, CMA  Assessment:   Well woman visit with gynecologic exam. FH breast cancer in mother.  Negative BRCA testing.  Menopausal atrophy, controlled  with vaginal estradiol cream.  Elevated cholesterol.  Plan: Mammogram screening discussed. Self breast awareness reviewed. Pap and HR HPV 2027. Guidelines for Calcium, Vitamin D, regular exercise program including cardiovascular and weight bearing exercise. Refill of vaginal estrogen cream.  I discussed potential effect on breast cancer.   Follow up annually and prn.

## 2022-12-02 ENCOUNTER — Ambulatory Visit (INDEPENDENT_AMBULATORY_CARE_PROVIDER_SITE_OTHER): Payer: Managed Care, Other (non HMO) | Admitting: Obstetrics and Gynecology

## 2022-12-02 ENCOUNTER — Encounter: Payer: Self-pay | Admitting: Obstetrics and Gynecology

## 2022-12-02 VITALS — BP 112/72 | HR 70 | Ht 65.25 in | Wt 157.0 lb

## 2022-12-02 DIAGNOSIS — Z01419 Encounter for gynecological examination (general) (routine) without abnormal findings: Secondary | ICD-10-CM

## 2022-12-02 MED ORDER — ESTRADIOL 0.1 MG/GM VA CREA: TOPICAL_CREAM | VAGINAL | 2 refills | Status: AC

## 2022-12-02 NOTE — Patient Instructions (Signed)

## 2023-01-21 ENCOUNTER — Telehealth: Payer: Self-pay

## 2023-01-21 DIAGNOSIS — R41840 Attention and concentration deficit: Secondary | ICD-10-CM

## 2023-01-21 NOTE — Telephone Encounter (Signed)
Pt requesting a refill on ritalin 10 mg

## 2023-01-28 MED ORDER — METHYLPHENIDATE HCL 10 MG PO TABS: 10.0000 mg | ORAL_TABLET | Freq: Every day | ORAL | 0 refills | Status: DC

## 2023-01-28 NOTE — Telephone Encounter (Signed)
Medication sent.

## 2023-03-22 ENCOUNTER — Ambulatory Visit: Payer: Managed Care, Other (non HMO) | Admitting: Obstetrics and Gynecology

## 2023-04-06 ENCOUNTER — Telehealth: Payer: Self-pay | Admitting: Nurse Practitioner

## 2023-04-06 DIAGNOSIS — R41840 Attention and concentration deficit: Secondary | ICD-10-CM

## 2023-04-06 MED ORDER — METHYLPHENIDATE HCL 10 MG PO TABS: 10.0000 mg | ORAL_TABLET | Freq: Every day | ORAL | 0 refills | Status: DC

## 2023-04-06 NOTE — Telephone Encounter (Signed)
Tanya Andrews needs a refill on her ritalin to  [facility] [id number] - [place], [place] - [address]  She is also requesting refills to be sent in, instead of only 30 days. Pt is aware Huntley Dec is out of the office this week

## 2023-06-01 ENCOUNTER — Telehealth: Payer: Self-pay | Admitting: Nurse Practitioner

## 2023-06-01 DIAGNOSIS — R41840 Attention and concentration deficit: Secondary | ICD-10-CM

## 2023-06-01 NOTE — Telephone Encounter (Signed)
Pt would like refill on her Ritalin to [facility], also she would like 3 months sent in.  I scheduled her CPE for 10/07/23 when due.

## 2023-06-03 MED ORDER — METHYLPHENIDATE HCL 10 MG PO TABS: 10.0000 mg | ORAL_TABLET | Freq: Every day | ORAL | 0 refills | Status: DC

## 2023-10-07 ENCOUNTER — Encounter: Payer: Managed Care, Other (non HMO) | Admitting: Nurse Practitioner

## 2023-10-20 ENCOUNTER — Telehealth (INDEPENDENT_AMBULATORY_CARE_PROVIDER_SITE_OTHER): Admitting: Nurse Practitioner

## 2023-10-20 ENCOUNTER — Encounter: Payer: Self-pay | Admitting: Nurse Practitioner

## 2023-10-20 DIAGNOSIS — R41840 Attention and concentration deficit: Secondary | ICD-10-CM

## 2023-10-20 DIAGNOSIS — L821 Other seborrheic keratosis: Secondary | ICD-10-CM | POA: Insufficient documentation

## 2023-10-20 MED ORDER — METHYLPHENIDATE HCL 10 MG PO TABS: 10.0000 mg | ORAL_TABLET | Freq: Two times a day (BID) | ORAL | 0 refills | Status: DC

## 2023-10-20 NOTE — Assessment & Plan Note (Signed)
 Experiencing difficulty with focus and staying on task at work, managed with methylphenidate  10 mg once daily, Monday through Friday, with significant improvement in focus. Decline in focus around 3 PM likely due to medication wearing off. Open to trying an additional 5 mg dose at noon to extend effect into the afternoon, especially on days requiring prolonged focus. Informed that taking an additional dose around noon should not interfere with sleep if not taken after 2 PM. - Prescribe methylphenidate  10 mg for morning use with an optional additional 5 mg dose at noon if needed. - Instruct to monitor effects of the additional dose and adjust as necessary. - Provide prescriptions for three months with instructions for refills. - Advise to use a pill cutter if needed to adjust doses.

## 2023-10-20 NOTE — Progress Notes (Signed)
 Virtual Visit Encounter mychart visit.   I connected with  Sabas Cradle on 10/20/23 at  3:45 PM EDT by secure video and audio telemedicine application. I verified that I am speaking with the correct person using two identifiers.   I introduced myself as a Publishing rights manager with the practice. The limitations of evaluation and management by telemedicine discussed with the patient and the availability of in person appointments. The patient expressed verbal understanding and consent to proceed.  Participating parties in this visit include: Myself and patient  The patient is: Patient Location: Home I am: Provider Location: Office/Clinic Subjective:    CC and HPI:  History of Present Illness Tanya Andrews is a 58 year old female who presents for a follow-up on her medication, methylphenidate .  She takes methylphenidate  10 mg once daily, Monday through Friday, typically between 7:20 and 7:30 AM, to help with focus and memory issues at work. The medication significantly improves her ability to stay focused and on target, especially in her work environment where she is often pulled in different directions. She notices a marked difference in her performance on days she forgets to take it.  Around 3 PM, she experiences a decline in focus, which she attributes to the long workday. Despite this, she feels the medication remains effective compared to when she was not taking it.  She has received the shingles vaccine and is up to date with her vaccinations.  Past medical history, Surgical history, Family history not pertinant except as noted below, Social history, Allergies, and medications have been entered into the medical record, reviewed, and corrections made.   Review of Systems:  All review of systems negative except what is listed in the HPI  Objective:    Alert and oriented x 4 Speaking in clear sentences with no shortness of breath. No distress.  Impression and  Recommendations:    Problem List Items Addressed This Visit     Attention deficit   Experiencing difficulty with focus and staying on task at work, managed with methylphenidate  10 mg once daily, Monday through Friday, with significant improvement in focus. Decline in focus around 3 PM likely due to medication wearing off. Open to trying an additional 5 mg dose at noon to extend effect into the afternoon, especially on days requiring prolonged focus. Informed that taking an additional dose around noon should not interfere with sleep if not taken after 2 PM. - Prescribe methylphenidate  10 mg for morning use with an optional additional 5 mg dose at noon if needed. - Instruct to monitor effects of the additional dose and adjust as necessary. - Provide prescriptions for three months with instructions for refills. - Advise to use a pill cutter if needed to adjust doses.       Relevant Medications   methylphenidate  (RITALIN ) 10 MG tablet (Start on 11/17/2023)   methylphenidate  (RITALIN ) 10 MG tablet   methylphenidate  (RITALIN ) 10 MG tablet (Start on 12/15/2023)    orders and follow up as documented in EMR I discussed the assessment and treatment plan with the patient. The patient was provided an opportunity to ask questions and all were answered. The patient agreed with the plan and demonstrated an understanding of the instructions.   The patient was advised to call back or seek an in-person evaluation if the symptoms worsen or if the condition fails to improve as anticipated.  Follow-Up: CPE in November scheduled.   I provided 21 minutes of non-face-to-face interaction with this non face-to-face encounter including  intake, same-day documentation, and chart review.   Annella Kief, NP , DNP, AGNP-c [facility]

## 2023-11-02 ENCOUNTER — Other Ambulatory Visit (HOSPITAL_COMMUNITY): Payer: Self-pay

## 2023-11-17 ENCOUNTER — Encounter: Admitting: Nurse Practitioner

## 2023-11-18 ENCOUNTER — Encounter: Admitting: Nurse Practitioner

## 2023-11-23 ENCOUNTER — Other Ambulatory Visit: Payer: Self-pay | Admitting: Nurse Practitioner

## 2023-11-23 DIAGNOSIS — R41840 Attention and concentration deficit: Secondary | ICD-10-CM

## 2023-11-23 MED ORDER — METHYLPHENIDATE HCL 10 MG PO TABS: 10.0000 mg | ORAL_TABLET | Freq: Two times a day (BID) | ORAL | 0 refills | Status: DC

## 2023-11-23 NOTE — Telephone Encounter (Signed)
 Copied from CRM #[id number]. Topic: Clinical - Medication Refill >> Nov 23, 2023  1:13 PM Carlatta H wrote: Medication: methylphenidate  (RITALIN ) 10 MG tablet [[id number]]  Has the patient contacted their pharmacy? No (Agent: If no, request that the patient contact the pharmacy for the refill. If patient does not wish to contact the pharmacy document the reason why and proceed with request.) (Agent: If yes, when and what did the pharmacy advise?)  This is the patient's preferred pharmacy:  [id number] [place], [place] - [address] [place] [address] Phone: [phone] Fax: [phone]  Is this the correct pharmacy for this prescription? Yes If no, delete pharmacy and type the correct one.   Has the prescription been filled recently? No  Is the patient out of the medication? Yes  Has the patient been seen for an appointment in the last year OR does the patient have an upcoming appointment? Yes  Can we respond through MyChart? No  Agent: Please be advised that Rx refills may take up to 3 business days. We ask that you follow-up with your pharmacy.

## 2024-02-06 ENCOUNTER — Other Ambulatory Visit: Payer: Self-pay | Admitting: Obstetrics and Gynecology

## 2024-02-06 DIAGNOSIS — Z136 Encounter for screening for cardiovascular disorders: Secondary | ICD-10-CM

## 2024-02-14 ENCOUNTER — Other Ambulatory Visit (HOSPITAL_BASED_OUTPATIENT_CLINIC_OR_DEPARTMENT_OTHER): Payer: Self-pay | Admitting: Obstetrics and Gynecology

## 2024-02-14 DIAGNOSIS — Z136 Encounter for screening for cardiovascular disorders: Secondary | ICD-10-CM

## 2024-03-08 ENCOUNTER — Ambulatory Visit: Payer: Self-pay | Admitting: Nurse Practitioner

## 2024-03-08 ENCOUNTER — Encounter: Payer: Self-pay | Admitting: Nurse Practitioner

## 2024-03-08 VITALS — BP 122/74 | HR 64 | Ht 65.0 in | Wt 167.4 lb

## 2024-03-08 DIAGNOSIS — R7303 Prediabetes: Secondary | ICD-10-CM

## 2024-03-08 DIAGNOSIS — Z23 Encounter for immunization: Secondary | ICD-10-CM | POA: Diagnosis not present

## 2024-03-08 DIAGNOSIS — R41840 Attention and concentration deficit: Secondary | ICD-10-CM | POA: Diagnosis not present

## 2024-03-08 DIAGNOSIS — Z1211 Encounter for screening for malignant neoplasm of colon: Secondary | ICD-10-CM

## 2024-03-08 DIAGNOSIS — M545 Low back pain, unspecified: Secondary | ICD-10-CM

## 2024-03-08 DIAGNOSIS — Z0000 Encounter for general adult medical examination without abnormal findings: Secondary | ICD-10-CM

## 2024-03-08 DIAGNOSIS — E782 Mixed hyperlipidemia: Secondary | ICD-10-CM

## 2024-03-08 DIAGNOSIS — R7301 Impaired fasting glucose: Secondary | ICD-10-CM

## 2024-03-08 DIAGNOSIS — Z79890 Hormone replacement therapy (postmenopausal): Secondary | ICD-10-CM | POA: Diagnosis not present

## 2024-03-08 DIAGNOSIS — M65341 Trigger finger, right ring finger: Secondary | ICD-10-CM

## 2024-03-08 MED ORDER — METHYLPHENIDATE HCL 10 MG PO TABS: 10.0000 mg | ORAL_TABLET | Freq: Two times a day (BID) | ORAL | 0 refills | Status: DC

## 2024-03-08 MED ORDER — METHYLPHENIDATE HCL 10 MG PO TABS: 10.0000 mg | ORAL_TABLET | Freq: Two times a day (BID) | ORAL | 0 refills | Status: AC

## 2024-03-08 NOTE — Patient Instructions (Addendum)
 MMR titers [id number]  There are no preventive care reminders to display for this patient.   For all adult patients, I recommend A well balanced diet low in saturated fats, cholesterol, and moderation in carbohydrates.   This can be as simple as monitoring portion sizes and cutting back on sugary beverages such as soda and juice to start with.    Daily water consumption of at least 64 ounces.  Physical activity at least 180 minutes per week, if just starting out.   This can be as simple as taking the stairs instead of the elevator and walking 2-3 laps around the office  purposefully every day.   STD protection, partner selection, and regular testing if high risk.  Limited consumption of alcoholic beverages if alcohol is consumed.  For women, I recommend no more than 7 alcoholic beverages per week, spread out throughout the week.  Avoid binge drinking or consuming large quantities of alcohol in one setting.   Please let me know if you feel you may need help with reduction or quitting alcohol consumption.   Avoidance of nicotine, if used.  Please let me know if you feel you may need help with reduction or quitting nicotine use.   Daily mental health attention.  This can be in the form of 5 minute daily meditation, prayer, journaling, yoga, reflection, etc.   Purposeful attention to your emotions and mental state can significantly improve your overall wellbeing  and  Health.  Please know that I am here to help you with all of your health care goals and am happy to work with you to find a solution that works best for you.  The greatest advice I have received with any changes in life are to take it one step at a time, that even means if all you can focus on is the next 60 seconds, then do that and celebrate your victories.  With any changes in life, you will have set backs, and that is OK. The important thing to remember is, if you have a set back, it is not a failure, it is an opportunity to  try again!  Health Maintenance Recommendations Screening Testing Mammogram Every 1 -2 years based on history and risk factors Starting at age 82 Pap Smear Ages 21-39 every 3 years Ages 50-65 every 5 years with HPV testing More frequent testing may be required based on results and history Colon Cancer Screening Every 1-10 years based on test performed, risk factors, and history Starting at age 5 Bone Density Screening Every 2-10 years based on history Starting at age 37 for women Recommendations for men differ based on medication usage, history, and risk factors AAA Screening One time ultrasound Men 13-60 years old who have every smoked Lung Cancer Screening Low Dose Lung CT every 12 months Age 63-80 years with a 30 pack-year smoking history who still smoke or who have quit within the last 15 years  Screening Labs Routine  Labs: Complete Blood Count (CBC), Complete Metabolic Panel (CMP), Cholesterol (Lipid Panel) Every 6-12 months based on history and medications May be recommended more frequently based on current conditions or previous results Hemoglobin A1c Lab Every 3-12 months based on history and previous results Starting at age 41 or earlier with diagnosis of diabetes, high cholesterol, BMI >26, and/or risk factors Frequent monitoring for patients with diabetes to ensure blood sugar control Thyroid Panel (TSH w/ T3 & T4) Every 6 months based on history, symptoms, and risk factors May be repeated more  often if on medication HIV One time testing for all patients 13 and older May be repeated more frequently for patients with increased risk factors or exposure Hepatitis C One time testing for all patients 51 and older May be repeated more frequently for patients with increased risk factors or exposure Gonorrhea, Chlamydia Every 12 months for all sexually active persons 13-24 years Additional monitoring may be recommended for those who are considered high risk or who have  symptoms PSA Men 65-58 years old with risk factors Additional screening may be recommended from age 16-69 based on risk factors, symptoms, and history  Vaccine Recommendations Tetanus Booster All adults every 10 years Flu Vaccine All patients 6 months and older every year COVID Vaccine All patients 12 years and older Initial dosing with booster May recommend additional booster based on age and health history HPV Vaccine 2 doses all patients age 7-26 Dosing may be considered for patients over 26 Shingles Vaccine (Shingrix) 2 doses all adults 55 years and older Pneumonia (Pneumovax 23) All adults 65 years and older May recommend earlier dosing based on health history Pneumonia (Prevnar 73) All adults 65 years and older Dosed 1 year after Pneumovax 23  Additional Screening, Testing, and Vaccinations may be recommended on an individualized basis based on family history, health history, risk factors, and/or exposure.

## 2024-03-08 NOTE — Progress Notes (Signed)
 Tanya Doing, DNP, AGNP-c [facility] [address] [place], [place] [address] Main Office [phone] VISIT TYPE: CPE on 03/08/2024 Today's Vitals   03/08/24 0852  BP: 122/74  Pulse: 64  Weight: 167 lb 6.4 oz (75.9 kg)  Height: 5' 5 (1.651 m)   Body mass index is 27.86 kg/m. BP 122/74   Pulse 64   Ht 5' 5 (1.651 m)   Wt 167 lb 6.4 oz (75.9 kg)   LMP 05/04/2015 (Approximate)   BMI 27.86 kg/m   Subjective:    Patient ID: Tanya Andrews, female    DOB: April 28, 1966, 58 y.o.   MRN: [id number]  HPI:  History of Present Illness Tanya Andrews is a 58 year old female who presents for her annual fasting exam.  She recently started on estradiol  and progesterone therapy two weeks ago, prescribed by her OB GYN. She was initially hesitant but decided to proceed after researching the benefits for bone and brain health.  She is managing her cholesterol levels through dietary adjustments and is scheduled for a cardiac CT scan in December to establish a baseline. Her total cholesterol is 275 mg/dL, triglycerides are 55 mg/dL, HDL is 897 mg/dL, and LDL is 834 mg/dL. She uses fish oil supplements and regularly consumes salmon to help manage her cholesterol.  Her hemoglobin A1c is 5.5%. She has experienced occasional blood sugar spikes, including one incident where she felt unable to drive due to feeling unwell. She monitored her blood sugar for a month, noting morning levels between 103 and 120 mg/dL, but has not had recent issues.  She plans to schedule a colonoscopy Tanya Andrews next year and has identified a preferred location based on her husband's experience. She also plans to have surgery for a trigger finger Tanya Andrews next year.  She has a prescription for a muscle relaxer from 2019, which she uses sparingly for occasional back issues, typically about three times a year.  She takes ADHD medication, 10 mg once daily, Monday through Friday, and has about ten pills  left before needing a refill.  She recently returned from a five-day trip to [place], where she attended a conference and indulged in foods she doesn't normally eat, resulting in a temporary weight gain of about four pounds.  No chest pain, shortness of breath, new lumps, bumps, moles, or freckles.  Methocarbamol 500 if needed for back spasms  Pertinent items are noted in HPI.  Most Recent Depression Screen:     03/08/2024    8:48 AM 10/20/2023    3:39 PM 09/28/2022   11:27 AM 09/17/2021    8:06 AM  Depression screen PHQ 2/9  Decreased Interest 0 0 0 0  Down, Depressed, Hopeless 0 0 0 0  PHQ - 2 Score 0 0 0 0   Most Recent Anxiety Screen:      No data to display         Most Recent Fall Screen:    03/08/2024    8:48 AM 10/20/2023    3:38 PM 09/28/2022   11:27 AM 09/17/2021    8:06 AM  Fall Risk   Falls in the past year? 0 0 0 0  Number falls in past yr: 0 0 0 0  Injury with Fall? 0 0 0 0  Risk for fall due to : No Fall Risks No Fall Risks No Fall Risks No Fall Risks  Follow up Falls evaluation completed Falls evaluation completed Falls evaluation completed Education provided;Falls evaluation completed  Data saved with a previous flowsheet row definition    Past medical history, surgical history, medications, allergies, family history and social history reviewed with patient today and changes made to appropriate areas of the chart.  Past Medical History:  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   Toe fracture    late 20s   Medications:  Current Outpatient Medications on File Prior to Visit  Medication Sig   Calcium Carb-Cholecalciferol (CALCIUM 500 + D PO)    desonide (DESOWEN) 0.05 % cream 1 application a thin film to affected area Externally Twice a day; Duration: 14 days Use twice a day for two weeks, then once a day every other day   estradiol  (ESTRACE ) 0.1 MG/GM vaginal cream Place 1/2 gram per vagina at  bedtime 2- 3 times per week.   estradiol  (VIVELLE -DOT) 0.0375 MG/24HR Place 1 patch onto the skin 2 (two) times a week.   Krill Oil Omega-3 500 MG CAPS    magnesium 30 MG tablet Take 30 mg by mouth 2 (two) times daily.   Multiple Vitamin (MULTIVITAMIN ADULT PO)    progesterone (PROMETRIUM) 100 MG capsule Take 100 mg by mouth daily.   mometasone  (ELOCON ) 0.1 % cream Apply to the toenail twice a day as directed with vitamin A&D ointment. (Patient not taking: Reported on 03/08/2024)   No current facility-administered medications on file prior to visit.   Surgical History:  Past Surgical History:  Procedure Laterality Date   NO PAST SURGERIES     Allergies:  Allergies  Allergen Reactions   Azithromycin Nausea And Vomiting   Erythromycin Nausea Only   Prednisone Other (See Comments)   Family History:  Family History  Problem Relation Age of Onset   Angina Father 58   Heart disease Father    Heart attack Paternal Grandfather    Breast cancer Mother        69 & 60 had lumpectomy first time then mastectomy--Neg BRCA   Diabetes Mother        diet controlled   Hypertension Mother        Objective:    BP 122/74   Pulse 64   Ht 5' 5 (1.651 m)   Wt 167 lb 6.4 oz (75.9 kg)   LMP 05/04/2015 (Approximate)   BMI 27.86 kg/m   Wt Readings from Last 3 Encounters:  03/08/24 167 lb 6.4 oz (75.9 kg)  10/20/23 161 lb 3.2 oz (73.1 kg)  12/02/22 157 lb (71.2 kg)    Physical Exam Vitals and nursing note reviewed.  Constitutional:      General: She is not in acute distress.    Appearance: Normal appearance.  HENT:     Head: Normocephalic and atraumatic.     Right Ear: Hearing, tympanic membrane, ear canal and external ear normal.     Left Ear: Hearing, tympanic membrane, ear canal and external ear normal.     Nose: Nose normal.     Right Sinus: No maxillary sinus tenderness or frontal sinus tenderness.     Left Sinus: No maxillary sinus tenderness or frontal sinus tenderness.      Mouth/Throat:     Lips: Pink.     Mouth: Mucous membranes are moist.     Pharynx: Oropharynx is clear.  Eyes:     General: Lids are normal. Vision grossly intact.     Extraocular Movements: Extraocular movements intact.     Conjunctiva/sclera: Conjunctivae normal.  Pupils: Pupils are equal, round, and reactive to light.     Funduscopic exam:    Right eye: Red reflex present.        Left eye: Red reflex present.    Visual Fields: Right eye visual fields normal and left eye visual fields normal.  Neck:     Thyroid: No thyromegaly.     Vascular: No carotid bruit.  Cardiovascular:     Rate and Rhythm: Normal rate and regular rhythm.     Chest Wall: PMI is not displaced.     Pulses: Normal pulses.          Dorsalis pedis pulses are 2+ on the right side and 2+ on the left side.       Posterior tibial pulses are 2+ on the right side and 2+ on the left side.     Heart sounds: Normal heart sounds. No murmur heard. Pulmonary:     Effort: Pulmonary effort is normal. No respiratory distress.     Breath sounds: Normal breath sounds.  Abdominal:     General: Abdomen is flat. Bowel sounds are normal. There is no distension.     Palpations: Abdomen is soft. There is no hepatomegaly, splenomegaly or mass.     Tenderness: There is no abdominal tenderness. There is no right CVA tenderness, left CVA tenderness, guarding or rebound.  Musculoskeletal:        General: Normal range of motion.     Cervical back: Full passive range of motion without pain, normal range of motion and neck supple. No tenderness.     Right lower leg: No edema.     Left lower leg: No edema.  Feet:     Left foot:     Toenail Condition: Left toenails are normal.  Lymphadenopathy:     Cervical: No cervical adenopathy.     Upper Body:     Right upper body: No supraclavicular adenopathy.     Left upper body: No supraclavicular adenopathy.  Skin:    General: Skin is warm and dry.     Capillary Refill: Capillary refill  takes less than 2 seconds.     Nails: There is no clubbing.  Neurological:     General: No focal deficit present.     Mental Status: She is alert and oriented to person, place, and time.     GCS: GCS eye subscore is 4. GCS verbal subscore is 5. GCS motor subscore is 6.     Sensory: Sensation is intact.     Motor: Motor function is intact.     Coordination: Coordination is intact.     Gait: Gait is intact.     Deep Tendon Reflexes: Reflexes are normal and symmetric.  Psychiatric:        Attention and Perception: Attention normal.        Mood and Affect: Mood normal.        Speech: Speech normal.        Behavior: Behavior normal. Behavior is cooperative.        Thought Content: Thought content normal.        Cognition and Memory: Cognition and memory normal.        Judgment: Judgment normal.      Results for orders placed or performed in visit on 09/28/22  Hemoglobin A1c   Collection Time: 09/28/22 12:27 PM  Result Value Ref Range   Hgb A1c MFr Bld 5.8 (H) 4.8 - 5.6 %   Est. average glucose Bld  gHb Est-mCnc 120 mg/dL  CBC with Differential/Platelet   Collection Time: 09/28/22 12:27 PM  Result Value Ref Range   WBC 3.6 3.4 - 10.8 x10E3/uL   RBC 4.12 3.77 - 5.28 x10E6/uL   Hemoglobin 13.1 11.1 - 15.9 g/dL   Hematocrit 60.8 65.9 - 46.6 %   MCV 95 79 - 97 fL   MCH 31.8 26.6 - 33.0 pg   MCHC 33.5 31.5 - 35.7 g/dL   RDW 88.2 88.2 - 84.5 %   Platelets 252 150 - 450 x10E3/uL   Neutrophils 46 Not Estab. %   Lymphs 44 Not Estab. %   Monocytes 7 Not Estab. %   Eos 2 Not Estab. %   Basos 1 Not Estab. %   Neutrophils Absolute 1.7 1.4 - 7.0 x10E3/uL   Lymphocytes Absolute 1.6 0.7 - 3.1 x10E3/uL   Monocytes Absolute 0.3 0.1 - 0.9 x10E3/uL   EOS (ABSOLUTE) 0.1 0.0 - 0.4 x10E3/uL   Basophils Absolute 0.0 0.0 - 0.2 x10E3/uL   Immature Granulocytes 0 Not Estab. %   Immature Grans (Abs) 0.0 0.0 - 0.1 x10E3/uL  Comprehensive metabolic panel   Collection Time: 09/28/22 12:27 PM  Result  Value Ref Range   Glucose 106 (H) 70 - 99 mg/dL   BUN 15 6 - 24 mg/dL   Creatinine, Ser 9.38 0.57 - 1.00 mg/dL   eGFR 896 >40 fO/fpw/8.26   BUN/Creatinine Ratio 25 (H) 9 - 23   Sodium 142 134 - 144 mmol/L   Potassium 4.5 3.5 - 5.2 mmol/L   Chloride 103 96 - 106 mmol/L   CO2 25 20 - 29 mmol/L   Calcium 9.9 8.7 - 10.2 mg/dL   Total Protein 7.1 6.0 - 8.5 g/dL   Albumin 4.7 3.8 - 4.9 g/dL   Globulin, Total 2.4 1.5 - 4.5 g/dL   Albumin/Globulin Ratio 2.0 1.2 - 2.2   Bilirubin Total 0.4 0.0 - 1.2 mg/dL   Alkaline Phosphatase 93 44 - 121 IU/L   AST 13 0 - 40 IU/L   ALT 19 0 - 32 IU/L  LP+Non-HDL Cholesterol   Collection Time: 09/28/22 12:27 PM  Result Value Ref Range   Cholesterol, Total 281 (H) 100 - 199 mg/dL   Triglycerides 85 0 - 149 mg/dL   HDL 892 >60 mg/dL   VLDL Cholesterol Cal 14 5 - 40 mg/dL   LDL Chol Calc (NIH) 839 (H) 0 - 99 mg/dL   Total Non-HDL-Chol (LDL+VLDL) 174 (H) 0 - 129 mg/dL       Assessment & Plan:   Problem List Items Addressed This Visit     Encounter for annual physical exam - Primary   CPE completed today. Review of HM activities and recommendations discussed and provided on AVS. Anticipatory guidance, diet, and exercise recommendations provided. Medications, allergies, and hx reviewed and updated as necessary. Orders placed as listed below.  Plan: - Labs ordered. Will make changes as necessary based on results.  - I will review these results and send recommendations via MyChart or a telephone call.  - F/U with CPE in 1 year or sooner for acute/chronic health needs as directed.        Lumbar pain   Intermittent back pain, possibly related to prolonged sitting or poor posture. Uses muscle relaxer as needed, approximately three times a year. - Prescribed muscle relaxer for as-needed use      Hyperlipidemia   Total cholesterol is 275 mg/dL, HDL is 897 mg/dL, LDL is 834 mg/dL, and triglycerides  are 55 mg/dL. High HDL is beneficial. Scheduled for a  cardiac CT scan in December to assess cardiovascular risk. Discussed the role of diet and exercise in managing cholesterol levels. - Proceed with scheduled cardiac CT scan in December - Continue dietary modifications and exercise regimen      RESOLVED: Impaired fasting glucose   Routine glucose monitoring recommended along with diet and exercise management. Will make changes as necessary based on findings.       Attention deficit   Currently managed with ADHD medication. Reports medication is effective and taken Monday through Friday. Tolerating well with no side effects or concerns with increased anxiety or sleeping. PDMP reviewed today.  - Refilled ADHD medication with three refills      Relevant Medications   methylphenidate  (RITALIN ) 10 MG tablet (Start on 04/05/2024)   methylphenidate  (RITALIN ) 10 MG tablet (Start on 05/03/2024)   methylphenidate  (RITALIN ) 10 MG tablet   Trigger ring finger of right hand   Surgery in upcoming year for management.       Pre-diabetes   Routine glucose monitoring recommended along with diet and exercise management. Will make changes as necessary based on findings.         Hormone replacement therapy (HRT)   Recently started on estradiol  and progesterone by OB GYN. Discussed benefits for bone health, cardiovascular health, and cognitive function. Addressed concerns about cancer risk. Recommend close monitoring of breasts with routine annual mammogram and weekly self breast examinations. Report any changes in breasts immediately. Considering genetic testing for BRCA gene due to family history of breast cancer. - Continue estradiol  and progesterone therapy as prescribed by OB GYN - Will consider genetic testing for BRCA gene      Other Visit Diagnoses       Need for influenza vaccination       Relevant Orders   Flu vaccine trivalent PF, 6mos and older(Flulaval,Afluria,Fluarix,Fluzone) (Completed)     Screening for colon cancer       Relevant  Orders   Ambulatory referral to Gastroenterology       Assessment and Plan Assessment & Plan       Follow up plan: Return in about 1 year (around 03/08/2025) for CPE.  NEXT PREVENTATIVE PHYSICAL DUE IN 1 YEAR.  PATIENT COUNSELING PROVIDED FOR ALL ADULT PATIENTS: A well balanced diet low in saturated fats, cholesterol, and moderation in carbohydrates.  This can be as simple as monitoring portion sizes and cutting back on sugary beverages such as soda and juice to start with.    Daily water consumption of at least 64 ounces.  Physical activity at least 180 minutes per week.  If just starting out, start 10 minutes a day and work your way up.   This can be as simple as taking the stairs instead of the elevator and walking 2-3 laps around the office  purposefully every day.   STD protection, partner selection, and regular testing if high risk.  Limited consumption of alcoholic beverages if alcohol is consumed. For men, I recommend no more than 14 alcoholic beverages per week, spread out throughout the week (max 2 per day). Avoid binge drinking or consuming large quantities of alcohol in one setting.  Please let me know if you feel you may need help with reduction or quitting alcohol consumption.   Avoidance of nicotine, if used. Please let me know if you feel you may need help with reduction or quitting nicotine use.   Daily mental health attention. This can be  in the form of 5 minute daily meditation, prayer, journaling, yoga, reflection, etc.  Purposeful attention to your emotions and mental state can significantly improve your overall wellbeing  and  Health.  Please know that I am here to help you with all of your health care goals and am happy to work with you to find a solution that works best for you.  The greatest advice I have received with any changes in life are to take it one step at a time, that even means if all you can focus on is the next 60 seconds, then do  that and celebrate your victories.  With any changes in life, you will have set backs, and that is OK. The important thing to remember is, if you have a set back, it is not a failure, it is an opportunity to try again! Screening Testing Mammogram Every 1 -2 years based on history and risk factors Starting at age 74 Pap Smear Ages 21-39 every 3 years Ages 26-65 every 5 years with HPV testing More frequent testing may be required based on results and history Colon Cancer Screening Every 1-10 years based on test performed, risk factors, and history Starting at age 69 Bone Density Screening Every 2-10 years based on history Starting at age 31 for women Recommendations for men differ based on medication usage, history, and risk factors AAA Screening One time ultrasound Men 42-32 years old who have every smoked Lung Cancer Screening Low Dose Lung CT every 12 months Age 76-80 years with a 30 pack-year smoking history who still smoke or who have quit within the last 15 years   Screening Labs Routine  Labs: Complete Blood Count (CBC), Complete Metabolic Panel (CMP), Cholesterol (Lipid Panel) Every 6-12 months based on history and medications May be recommended more frequently based on current conditions or previous results Hemoglobin A1c Lab Every 3-12 months based on history and previous results Starting at age 30 or earlier with diagnosis of diabetes, high cholesterol, BMI >26, and/or risk factors Frequent monitoring for patients with diabetes to ensure blood sugar control Thyroid Panel (TSH) Every 6 months based on history, symptoms, and risk factors May be repeated more often if on medication HIV One time testing for all patients 83 and older May be repeated more frequently for patients with increased risk factors or exposure Hepatitis C One time testing for all patients 35 and older May be repeated more frequently for patients with increased risk factors or exposure Gonorrhea,  Chlamydia Every 12 months for all sexually active persons 13-24 years Additional monitoring may be recommended for those who are considered high risk or who have symptoms Every 12 months for any woman on birth control, regardless of sexual activity PSA Men 68-73 years old with risk factors Additional screening may be recommended from age 19-69 based on risk factors, symptoms, and history  Vaccine Recommendations Tetanus Booster All adults every 10 years Flu Vaccine All patients 6 months and older every year COVID Vaccine All patients 12 years and older Initial dosing with booster May recommend additional booster based on age and health history HPV Vaccine 2 doses all patients age 30-26 Dosing may be considered for patients over 26 Shingles Vaccine (Shingrix) 2 doses all adults 55 years and older Pneumonia (Pneumovax 72) All adults 65 years and older May recommend earlier dosing based on health history One year apart from Prevnar 19 Pneumonia (Prevnar 46) All adults 65 years and older Dosed 1 year after Pneumovax 23 Pneumonia (Prevnar 20)  One time alternative to the two dosing of 13 and 23 For all adults with initial dose of 23, 20 is recommended 1 year later For all adults with initial dose of 13, 23 is still recommended as second option 1 year later

## 2024-03-12 ENCOUNTER — Ambulatory Visit (HOSPITAL_BASED_OUTPATIENT_CLINIC_OR_DEPARTMENT_OTHER)
Admission: RE | Admit: 2024-03-12 | Discharge: 2024-03-12 | Disposition: A | Discharge status: Home / Self Care | Payer: Self-pay | Source: Ambulatory Visit | Attending: Obstetrics and Gynecology | Admitting: Obstetrics and Gynecology

## 2024-03-12 DIAGNOSIS — Z136 Encounter for screening for cardiovascular disorders: Secondary | ICD-10-CM | POA: Insufficient documentation

## 2024-03-12 MED ORDER — METHYLPHENIDATE HCL 10 MG PO TABS: 10.0000 mg | ORAL_TABLET | Freq: Two times a day (BID) | ORAL | 0 refills | Status: AC

## 2024-03-19 DIAGNOSIS — Z79890 Hormone replacement therapy (postmenopausal): Secondary | ICD-10-CM | POA: Insufficient documentation

## 2024-03-19 NOTE — Assessment & Plan Note (Signed)
 Routine glucose monitoring recommended along with diet and exercise management. Will make changes as necessary based on findings.

## 2024-03-19 NOTE — Assessment & Plan Note (Signed)

## 2024-03-19 NOTE — Assessment & Plan Note (Signed)
 Total cholesterol is 275 mg/dL, HDL is 897 mg/dL, LDL is 834 mg/dL, and triglycerides are 55 mg/dL. High HDL is beneficial. Scheduled for a cardiac CT scan in December to assess cardiovascular risk. Discussed the role of diet and exercise in managing cholesterol levels. - Proceed with scheduled cardiac CT scan in December - Continue dietary modifications and exercise regimen

## 2024-03-19 NOTE — Assessment & Plan Note (Signed)
 Surgery in upcoming year for management.

## 2024-03-19 NOTE — Assessment & Plan Note (Signed)
 Intermittent back pain, possibly related to prolonged sitting or poor posture. Uses muscle relaxer as needed, approximately three times a year. - Prescribed muscle relaxer for as-needed use

## 2024-03-19 NOTE — Assessment & Plan Note (Signed)
 Recently started on estradiol  and progesterone by OB GYN. Discussed benefits for bone health, cardiovascular health, and cognitive function. Addressed concerns about cancer risk. Recommend close monitoring of breasts with routine annual mammogram and weekly self breast examinations. Report any changes in breasts immediately. Considering genetic testing for BRCA gene due to family history of breast cancer. - Continue estradiol  and progesterone therapy as prescribed by OB GYN - Will consider genetic testing for BRCA gene

## 2024-03-19 NOTE — Assessment & Plan Note (Signed)
 Currently managed with ADHD medication. Reports medication is effective and taken Monday through Friday. Tolerating well with no side effects or concerns with increased anxiety or sleeping. PDMP reviewed today.  - Refilled ADHD medication with three refills

## 2024-03-21 ENCOUNTER — Telehealth: Payer: Self-pay

## 2024-03-21 NOTE — Telephone Encounter (Signed)
 Physical exam emailed to the pt. to the requested email.    Copied from CRM #[id number]. Topic: General - Other >> Mar 21, 2024  2:50 PM [name] wrote: Reason for CRM: Patient is requesting a email for proof of her visit on 03/08/24 (physical). A receipt or documentation that says physical. I did advise her of maybe using my chart for proof but she would like proof sent to her email.   Email: [email or web address]

## 2024-06-07 ENCOUNTER — Ambulatory Visit (HOSPITAL_COMMUNITY)
Admission: RE | Admit: 2024-06-07 | Discharge: 2024-06-07 | Disposition: A | Source: Ambulatory Visit | Attending: Vascular Surgery

## 2024-06-07 ENCOUNTER — Other Ambulatory Visit (HOSPITAL_COMMUNITY): Payer: Self-pay | Admitting: Medical

## 2024-06-07 DIAGNOSIS — M25561 Pain in right knee: Secondary | ICD-10-CM

## 2025-03-20 ENCOUNTER — Encounter: Admitting: Nurse Practitioner
